# Patient Record
Sex: Female | Born: 1950 | Race: White | Hispanic: No | Marital: Married | Smoking: Never smoker
Health system: Southern US, Community
[De-identification: ages and names within clinical notes are randomized; demographics above are authoritative.]

## PROBLEM LIST (undated history)

## (undated) DIAGNOSIS — Z9889 Other specified postprocedural states: Secondary | ICD-10-CM

## (undated) DIAGNOSIS — Z973 Presence of spectacles and contact lenses: Secondary | ICD-10-CM

## (undated) DIAGNOSIS — R351 Nocturia: Secondary | ICD-10-CM

## (undated) DIAGNOSIS — R112 Nausea with vomiting, unspecified: Secondary | ICD-10-CM

## (undated) DIAGNOSIS — K449 Diaphragmatic hernia without obstruction or gangrene: Secondary | ICD-10-CM

## (undated) DIAGNOSIS — M1710 Unilateral primary osteoarthritis, unspecified knee: Secondary | ICD-10-CM

## (undated) DIAGNOSIS — E785 Hyperlipidemia, unspecified: Secondary | ICD-10-CM

## (undated) DIAGNOSIS — M1990 Unspecified osteoarthritis, unspecified site: Secondary | ICD-10-CM

## (undated) DIAGNOSIS — K5730 Diverticulosis of large intestine without perforation or abscess without bleeding: Secondary | ICD-10-CM

## (undated) DIAGNOSIS — E119 Type 2 diabetes mellitus without complications: Secondary | ICD-10-CM

## (undated) DIAGNOSIS — M179 Osteoarthritis of knee, unspecified: Secondary | ICD-10-CM

## (undated) DIAGNOSIS — K219 Gastro-esophageal reflux disease without esophagitis: Secondary | ICD-10-CM

## (undated) DIAGNOSIS — I10 Essential (primary) hypertension: Secondary | ICD-10-CM

## (undated) HISTORY — PX: KNEE ARTHROSCOPY: SUR90

## (undated) HISTORY — PX: APPENDECTOMY: SHX54

## (undated) HISTORY — PX: SHOULDER OPEN ROTATOR CUFF REPAIR: SHX2407

## (undated) HISTORY — PX: TUBAL LIGATION: SHX77

---

## 1992-09-08 HISTORY — PX: BREAST REDUCTION SURGERY: SHX8

## 2001-10-22 ENCOUNTER — Inpatient Hospital Stay (HOSPITAL_COMMUNITY): Admission: EM | Admit: 2001-10-22 | Discharge: 2001-10-23 | Payer: Self-pay | Admitting: Orthopedic Surgery

## 2009-06-22 ENCOUNTER — Encounter: Admission: RE | Admit: 2009-06-22 | Discharge: 2009-06-22 | Payer: Self-pay | Admitting: Orthopedic Surgery

## 2010-06-06 ENCOUNTER — Encounter
Admission: RE | Admit: 2010-06-06 | Discharge: 2010-08-21 | Payer: Self-pay | Source: Home / Self Care | Attending: Orthopedic Surgery | Admitting: Orthopedic Surgery

## 2011-01-24 NOTE — Op Note (Signed)
[facility]  Patient:    Krystal James, Krystal James Visit Number: [id number] MRN: [id number]          Service Type: SUR Location: 4W 0445 02 Attending Physician:  Marlowe Kays Page Dictated by:   Illene Labrador. Aplington, M.D. Proc. Date: 10/21/01 Admit Date:  10/21/2001                             Operative Report  PREOPERATIVE DIAGNOSIS:  Torn rotator cuff, right shoulder.  POSTOPERATIVE DIAGNOSIS:  Torn rotator cuff, right shoulder.  OPERATION: 1. Anterior acromionectomy. 2. Repair of torn rotator cuff, right shoulder.  SURGEON:  Illene Labrador. Aplington, M.D.  ASSISTANT:  Dorie Rank, P.A.-C.  ANESTHESIA:  General.  PATHOLOGY AND JUSTIFICATION FOR PROCEDURE:  She has had painful right shoulder for over a year and worse over the last 6-8 months.  Recent MRI has demonstrated a 1 cm tear with about 1 cm of retraction.  This was confirmed at surgery.  DESCRIPTION OF PROCEDURE:  Prophylactic antibiotics, preoperative intrascalene block by anesthesiologist, satisfactory general anesthesia, placed in the beach chair position on the Schlein frame.  The right shoulder girdle was prepped with DuraPrep and draped in a sterile field, Ioban employed.  Vertical incision from roughly the [facility] joint down to the greater tuberosity.  The incision was carried down to the anterior acromion with the fascia overlying it opened in line with the skin incision and reflected anteriorly and posteriorly back to the [facility] joint and also exposing the anterior acromion which I undermined with a small Cobb elevator followed by a large Cobb elevator to protect the underlying rotator cuff.  Then identified the [facility] joint with the [facility] needle, and marked out my initial line for anterior acromionectomy with cautery and then used a microsaw to make the initial cut.  She had a significant impingement problem, and I had to remove a good bit of underneath surface bone until the impingement problem was  corrected.  The tear was immediately identified and keeping with the MRI with about 1 cm in width and 1 cm of retraction at the level of the greater tuberosity.  After inspecting the underneath surface to make sure there was no retraction of underneath fibers, I created a small bony trough with the cautery and curved osteotome and small rongeur and made two lateral drill holes through the greater tuberosity.  I then placed a single #1 Ethibond horizontal mattress suture using these two holes and with the arm slightly abducted, tied the retracted portion of rotator cuff snugly in the bony trough.  I then supplemented this with multiple interrupted #1 Vicryl sutures over the terminal portion of the rotator cuff.  This gave a nice stable repair with impingement problem corrected.  The wound was then irrigated well with sterile saline.  The deltoid muscle I approximated with interrupted #1 Vicryl.  Because of the amount of anterior acromion I had to remove, I felt that she would have a better reconstitution of the deltoid fascial mechanism to the remainder of the acromion if I used sutures through drill holes in the anterior acromion.  So accordingly, I went ahead and made three drill holes and used three individual #1 Vicryl sutures supplemented by the normal fascial sutures of #1 Vicryl. This gave a nice stable, tight repair.  The wound was once irrigated with sterile saline.  Subcutaneous tissue was closed with a combination of #1 and 2-0 Vicryl with Steri-Strips  on the skin.  Dry sterile dressing, shoulder immobilizer applied.  She tolerated the procedure well and was taken to the recovery room with no known complications. Dictated by:   Illene Labrador. Aplington, M.D. Attending Physician:  Joaquin Courts DD:  10/21/01 TD:  10/21/01 Job: [id number]

## 2011-01-24 NOTE — H&P (Signed)
[facility]  Patient:    CHIYEKO, FERRE Visit Number: [id number] MRN: [id number]          Service Type: Attending:  Illene Labrador. Aplington, M.D. Dictated by:   Sammuel Cooper. Mahar, P.A. Adm. Date:  10/21/01                           History and Physical  DATE OF BIRTH:  [birth year]  CHIEF COMPLAINT:  Right shoulder pain.  HISTORY OF PRESENT ILLNESS:  The patient is a 60 year old female with a 12-15 month history of right shoulder pain.  MRI revealed a small, full thickness tear of the rotator cuff with retraction.  Because of these MRI findings and the patients significant pain, discomfort, and dysfunction in her shoulder, possibility of rotator cuff repair was discussed with the patient by Dr. Marlowe Kays, including risks and benefits.  The patient understands and agrees to proceed.  The patient denies any paresthesias, numbness, or paralysis of her extremities.  ALLERGIES:  SULFA causes a rash.  MEDICATIONS: 1. Norvasc 10 mg q.d. 2. Triamterene/hydrochlorothiazide 37.5/25 p.o. q.d. 3. Paxil CR 12.5 mg p.o. q.d. 4. Prevacid 30 mg p.o. q.d. 5. Vioxx 25 mg p.r.n. 6. Darvocet p.r.n.  PAST MEDICAL HISTORY: 1. Significant for asthma. 2. Anxiety. 3. Hypertension. 4. Hiatal hernia. 5. GERD. 6. History of ulcers. 7. Diverticulitis.  PAST SURGICAL HISTORY: 1. Tubal ligation in her 64s. 2. Appendectomy as a child. 3. Breast reduction in 1994.  SOCIAL HISTORY:  Denies tobacco use, denies alcohol use.  She is married, has two grown children.  Her husband will be available to help her postoperatively; however, he does work and she will be by herself throughout the days with occasional family assistance.  She does work as an Armed forces operational officer.  FAMILY MEDICAL HISTORY:  Mother alive at age 29 with coronary artery disease, hypertension and osteoarthritis.  Sister with hypertension and osteoarthritis. Father deceased at age 26 secondary to  cerebral aneurysm.  REVIEW OF SYSTEMS:  The patient denies any fevers, chills, sweats, or bleeding tendencies.  CENTRAL NERVOUS SYSTEM:  Denies blurred vision, double vision, seizures, headache, paralysis.  CARDIOVASCULAR:  Denies chest pain, angina, orthopnea, claudication, or palpitations.  PULMONARY:  Denies any shortness of breath, productive cough, or hemoptysis.  GASTROINTESTINAL:  Denies nausea, vomiting, constipation, diarrhea, melena, or bloody stools.  GENITOURINARY: Denies any dysuria, hematuria, discharge.  MUSCULOSKELETAL:  Per HPI.  PHYSICAL EXAMINATION:  VITAL SIGNS:  Blood pressure 150/88, respirations 16 and unlabored, pulse is 76 and regular.  GENERAL APPEARANCE:  The patient is a 60 year old white female.  She is alert and oriented in no acute distress, well-nourished, well-groomed, appears her stated age, very pleasant and cooperative to examination.  HEENT:  Head is normocephalic, atraumatic.  Pupils are equal, round, and reactive to light.  Extraocular movements intact.  Nares patent bilaterally. Pharynx is clear.  NECK:  Supple, soft to palpation.  No lymphadenopathy, no thyromegaly, no bruits appreciated.  CHEST:  Clear to auscultation bilaterally, no rales, rhonchi, stridor, or wheezes, or friction rub.  BREASTS:  Not pertinent, not performed.  HEART:  S1, S2, regular rate and rhythm, no murmurs, gallops, or rubs noted.  ABDOMEN:  Positive bowel sounds, soft to palpation, nontender, nondistended, no organomegaly noted.  GENITOURINARY:  Not pertinent, not performed.  EXTREMITIES:  Right shoulder with decreased range of motion secondary to pain and weakness.  Radial pulses are intact and equal bilaterally.  Sensation  is intact to bilateral upper extremities and equal.  Distal motor function is intact at 5/5.  SKIN:  Intact for any lesions or rashes.  LABORATORY:  MRI scan as above.  IMPRESSION: 1. Right rotator cuff tear. 2. Asthma. 3.  Anxiety. 4. Hypertension. 5. Hiatal hernia, gastroesophageal reflux disease, and history of ulcers. 6. History of diverticulitis.  PLAN:  Admit to [facility] on October 21, 2001 for a right rotator cuff repair by Dr. Marlowe Kays.  The patients primary care physician is Dr. Riley Nearing. Dictated by:   Sammuel Cooper. Mahar, P.A. Attending:  Illene Labrador. Aplington, M.D. DD:  10/13/01 TD:  10/13/01 Job: [id number]

## 2011-01-24 NOTE — Op Note (Signed)
[facility]  Patient:    Krystal James, Krystal James Visit Number: [id number] MRN: [id number]          Service Type: SUR Location: 4W 0445 02 Attending Physician:  Marlowe Kays Page Dictated by:   Illene Labrador. Aplington, M.D. Proc. Date: 10/21/01 Admit Date:  10/21/2001                             Operative Report  NO DICTATION. Dictated by:   Illene Labrador. Aplington, M.D. Attending Physician:  Joaquin Courts DD:  10/21/01 TD:  10/21/01 Job: [id number]

## 2012-09-14 ENCOUNTER — Emergency Department (HOSPITAL_BASED_OUTPATIENT_CLINIC_OR_DEPARTMENT_OTHER): Payer: BC Managed Care – PPO

## 2012-09-14 ENCOUNTER — Encounter (HOSPITAL_BASED_OUTPATIENT_CLINIC_OR_DEPARTMENT_OTHER): Payer: Self-pay | Admitting: *Deleted

## 2012-09-14 ENCOUNTER — Emergency Department (HOSPITAL_BASED_OUTPATIENT_CLINIC_OR_DEPARTMENT_OTHER)
Admission: EM | Admit: 2012-09-14 | Discharge: 2012-09-14 | Disposition: A | Discharge status: Home / Self Care | Payer: BC Managed Care – PPO | Attending: Emergency Medicine | Admitting: Emergency Medicine

## 2012-09-14 DIAGNOSIS — R209 Unspecified disturbances of skin sensation: Secondary | ICD-10-CM | POA: Insufficient documentation

## 2012-09-14 DIAGNOSIS — K219 Gastro-esophageal reflux disease without esophagitis: Secondary | ICD-10-CM | POA: Insufficient documentation

## 2012-09-14 DIAGNOSIS — E7800 Pure hypercholesterolemia, unspecified: Secondary | ICD-10-CM | POA: Insufficient documentation

## 2012-09-14 DIAGNOSIS — I10 Essential (primary) hypertension: Secondary | ICD-10-CM

## 2012-09-14 DIAGNOSIS — Z79899 Other long term (current) drug therapy: Secondary | ICD-10-CM | POA: Insufficient documentation

## 2012-09-14 DIAGNOSIS — E119 Type 2 diabetes mellitus without complications: Secondary | ICD-10-CM | POA: Insufficient documentation

## 2012-09-14 DIAGNOSIS — R4789 Other speech disturbances: Secondary | ICD-10-CM | POA: Insufficient documentation

## 2012-09-14 HISTORY — DX: Gastro-esophageal reflux disease without esophagitis: K21.9

## 2012-09-14 HISTORY — DX: Essential (primary) hypertension: I10

## 2012-09-14 LAB — BASIC METABOLIC PANEL
CO2: 27 mEq/L (ref 19–32)
Chloride: 99 mEq/L (ref 96–112)
Creatinine, Ser: 0.6 mg/dL (ref 0.50–1.10)

## 2012-09-14 LAB — CBC
MCH: 29.6 pg (ref 26.0–34.0)
RBC: 4.32 MIL/uL (ref 3.87–5.11)
RDW: 12.6 % (ref 11.5–15.5)

## 2012-09-14 MED ORDER — GABAPENTIN 100 MG PO CAPS: 100.0000 mg | ORAL_CAPSULE | Freq: Three times a day (TID) | ORAL | Status: DC

## 2012-09-14 NOTE — ED Notes (Addendum)
Headache this am. While at work she felt like her words were not coming out right per pt. She feels back to normal at this time. No hx of strokes or TIAs

## 2012-09-14 NOTE — ED Provider Notes (Signed)
History     CSN: [id number]  Arrival date & time 09/14/12  1721   First MD Initiated Contact with Patient 09/14/12 1750      Chief Complaint  Patient presents with  . Headache    (Consider location/radiation/quality/duration/timing/severity/associated sxs/prior treatment) HPI Pt presents with c/o sharp burning intermittent pains in right side of her head/scalp.  She states pains began this morning while brushing her hair.  She states she has had similar pains in the past but not this intense.  No trauma to head. No fever/neck pain.  No changes in vision or speech.  She states she was talking earlier and the pain was very intense causing her to have difficulty speaking.  She has not tried anything for her symptoms.  She has been taking her DM and HTN meds regularly.  There are no other associated systemic symptoms, there are no other alleviating or modifying factors.   Past Medical History  Diagnosis Date  . Hypertension   . Diabetes mellitus without complication   . GERD (gastroesophageal reflux disease)   . High cholesterol     Past Surgical History  Procedure Date  . Appendectomy   . Tubal ligation   . Knee surgery   . Foot surgery     No family history on file.  History  Substance Use Topics  . Smoking status: Never Smoker   . Smokeless tobacco: Not on file  . Alcohol Use: No    OB History    Grav Para Term Preterm Abortions TAB SAB Ect Mult Living                  Review of Systems ROS reviewed and all otherwise negative except for mentioned in HPI  Allergies  Sulfa antibiotics  Home Medications   Current Outpatient Rx  Name  Route  Sig  Dispense  Refill  . AMLODIPINE BESYLATE 10 MG PO TABS   Oral   Take 10 mg by mouth daily.         Marland Kitchen NEXIUM PO   Oral   Take by mouth.         . FENOFIBRATE 160 MG PO TABS   Oral   Take 160 mg by mouth daily.         Marland Kitchen METFORMIN HCL PO   Oral   Take by mouth.         . OMEGA-3-ACID ETHYL ESTERS 1 G  PO CAPS   Oral   Take 2 g by mouth 2 (two) times daily.         . CRESTOR PO   Oral   Take by mouth.         . TRIAMTERENE PO   Oral   Take by mouth.         Marland Kitchen GABAPENTIN 100 MG PO CAPS   Oral   Take 1 capsule (100 mg total) by mouth 3 (three) times daily.   90 capsule   0     BP 139/79  Pulse 64  Temp 97.6 F (36.4 C) (Oral)  Resp 18  SpO2 98% Vitals reviewed Physical Exam Physical Examination: General appearance - alert, well appearing, and in no distress Mental status - alert, oriented to person, place, and time Head - NCAT Eyes - pupils equal and reactive, extraocular eye movements intact Mouth - mucous membranes moist, pharynx normal without lesions Chest - clear to auscultation, no wheezes, rales or rhonchi, symmetric air entry Heart - normal rate, regular rhythm,  normal S1, S2, no murmurs, rubs, clicks or gallops Abdomen - soft, nontender, nondistended, no masses or organomegaly Neurological - alert, oriented, normal speech, cranial nerves 2-12 tested and intact, strength 5/5 in extremities x 4, sensation intact Extremities - peripheral pulses normal, no pedal edema, no clubbing or cyanosis Skin - normal coloration and turgor, no rashes  ED Course  Procedures (including critical care time)  Labs Reviewed  BASIC METABOLIC PANEL - Abnormal; Notable for the following:    Glucose, Bld 114 (*)     Calcium 10.6 (*)     All other components within normal limits  CBC   Ct Head Wo Contrast  09/14/2012  *RADIOLOGY REPORT*  Clinical Data: 62 year old female with headache and slurred speech.  CT HEAD WITHOUT CONTRAST  Technique:  Contiguous axial images were obtained from the base of the skull through the vertex without contrast.  Comparison: None  Findings: No intracranial abnormalities are identified, including mass lesion or mass effect, hydrocephalus, extra-axial fluid collection, midline shift, hemorrhage, or acute infarction.  The visualized bony calvarium is  unremarkable.  IMPRESSION: Unremarkable noncontrast head CT.   Original Report Authenticated By: Harmon Pier, M.D.      1. Scalp pain   2. Hypertension       MDM  Pt with lancinating intermittent scalp pains, no sign or symptoms of stroke or TIA, neuro exam normal.  Mildly hypertensive.  Head CT and labs reassuring.  Will start on neurontin and pt advised to f/u with PMD.  Discharged with strict return precautions.  Pt agreeable with plan.        Ethelda Chick, MD 09/14/12 2149

## 2012-09-14 NOTE — ED Notes (Signed)
Patient reports hx diabetes, states she has not checked her blood sugar since before Christmas. Pt states "I don't take good care of myself."

## 2012-09-14 NOTE — ED Notes (Signed)
Patient back from CT.

## 2012-09-14 NOTE — Discharge Instructions (Signed)
Return to the ED with any concerns including vomiting, changes in vision or speech, weakness of arms or legs, chest pain, shortness of breath, fainting, decreased level of alertness/lethargy, or any other alarming symptoms

## 2012-09-14 NOTE — ED Notes (Signed)
Patient transported to CT via stretcher.

## 2014-03-24 DIAGNOSIS — E1169 Type 2 diabetes mellitus with other specified complication: Secondary | ICD-10-CM | POA: Insufficient documentation

## 2014-03-24 DIAGNOSIS — E781 Pure hyperglyceridemia: Secondary | ICD-10-CM | POA: Insufficient documentation

## 2014-03-24 DIAGNOSIS — K219 Gastro-esophageal reflux disease without esophagitis: Secondary | ICD-10-CM | POA: Insufficient documentation

## 2014-03-24 DIAGNOSIS — E785 Hyperlipidemia, unspecified: Secondary | ICD-10-CM

## 2015-09-09 HISTORY — PX: ESOPHAGOGASTRODUODENOSCOPY: SHX1529

## 2016-09-08 HISTORY — PX: COLONOSCOPY: SHX174

## 2017-11-12 NOTE — Progress Notes (Signed)
Please place orders in Epic as patient is being scheduled for a pre-op appointment! Thank you! 

## 2017-11-30 NOTE — H&P (Signed)
TOTAL KNEE ADMISSION H&P  Patient is being admitted for right total knee arthroplasty.  Subjective:  Chief Complaint:   Right knee primary OA / pain  HPI: Krystal James, 67 y.o. female, has a history of pain and functional disability in the right knee due to arthritis and has failed non-surgical conservative treatments for greater than 12 weeks to includeNSAID's and/or analgesics, corticosteriod injections, viscosupplementation injections and activity modification.  Onset of symptoms was gradual, starting ~1 years ago with gradually worsening course since that time. The patient noted prior procedures on the knee to include  arthroscopy on the right knee(s).  Patient currently rates pain in the right knee(s) at 9 out of 10 with activity. Patient has night pain, worsening of pain with activity and weight bearing, pain that interferes with activities of daily living, pain with passive range of motion, crepitus and joint swelling.  Patient has evidence of periarticular osteophytes and joint space narrowing by imaging studies. There is no active infection.  Risks, benefits and expectations were discussed with the patient.  Risks including but not limited to the risk of anesthesia, blood clots, nerve damage, blood vessel damage, failure of the prosthesis, infection and up to and including death.  Patient understand the risks, benefits and expectations and wishes to proceed with surgery.   PCP: Patient, No Pcp Per  D/C Plans:       Home   Post-op Meds:       No Rx given  Tranexamic Acid:      To be given - IV   Decadron:      Is to be given  FYI:      ASA  Norco  DME:   Pt already has equipment  PT:   OPPT Rx given   Past Medical History:  Diagnosis Date  . Diabetes mellitus without complication   . GERD (gastroesophageal reflux disease)   . High cholesterol   . Hypertension     Past Surgical History:  Procedure Laterality Date  . APPENDECTOMY    . FOOT SURGERY    . KNEE SURGERY     . TUBAL LIGATION      No current facility-administered medications for this encounter.    Current Outpatient Medications  Medication Sig Dispense Refill Last Dose  . amLODipine (NORVASC) 10 MG tablet Take 10 mg by mouth daily. In the morning.     Marland Kitchen aspirin-acetaminophen-caffeine (EXCEDRIN MIGRAINE) 250-250-65 MG tablet Take 1 tablet by mouth every 6 (six) hours as needed for headache.     . esomeprazole (NEXIUM) 20 MG capsule Take 20 mg by mouth daily before breakfast.     . fenofibrate 160 MG tablet Take 160 mg by mouth daily.     Marland Kitchen lisinopril (PRINIVIL,ZESTRIL) 5 MG tablet Take 5 mg by mouth daily.     . metFORMIN (GLUCOPHAGE) 500 MG tablet Take 500 mg by mouth 3 (three) times daily with meals.     . rosuvastatin (CRESTOR) 10 MG tablet Take 10 mg by mouth daily with lunch.     . VOLTAREN 1 % GEL Apply 2 g topically 4 (four) times daily as needed. For knee pain.  0   . aspirin EC 81 MG tablet Take 81 mg by mouth daily.      Allergies  Allergen Reactions  . Sulfa Antibiotics Hives    Social History   Tobacco Use  . Smoking status: Never Smoker  Substance Use Topics  . Alcohol use: No  Review of Systems  Constitutional: Negative.   HENT: Negative.   Eyes: Negative.   Respiratory: Positive for shortness of breath (with exertion).   Cardiovascular: Negative.   Gastrointestinal: Positive for heartburn.  Genitourinary: Positive for frequency.  Musculoskeletal: Positive for joint pain.  Skin: Negative.   Neurological: Negative.   Endo/Heme/Allergies: Negative.   Psychiatric/Behavioral: Negative.     Objective:  Physical Exam  Constitutional: She is oriented to person, place, and time. She appears well-developed.  HENT:  Head: Normocephalic.  Eyes: Pupils are equal, round, and reactive to light.  Neck: Neck supple. No JVD present. No tracheal deviation present. No thyromegaly present.  Cardiovascular: Normal rate, regular rhythm and intact distal pulses.   Respiratory: Effort normal and breath sounds normal. No respiratory distress. She has no wheezes.  GI: Soft. There is no tenderness. There is no guarding.  Musculoskeletal:       Right knee: She exhibits decreased range of motion, swelling and bony tenderness. She exhibits no ecchymosis, no deformity, no laceration and no erythema. Tenderness found.  Lymphadenopathy:    She has no cervical adenopathy.  Neurological: She is alert and oriented to person, place, and time.  Skin: Skin is warm and dry.  Psychiatric: She has a normal mood and affect.      Imaging Review Plain radiographs demonstrate severe degenerative joint disease of the right knee.  The bone quality appears to be good for age and reported activity level.     Assessment/Plan:  End stage arthritis, right knee   The patient history, physical examination, clinical judgment of the provider and imaging studies are consistent with end stage degenerative joint disease of the right knee(s) and total knee arthroplasty is deemed medically necessary. The treatment options including medical management, injection therapy arthroscopy and arthroplasty were discussed at length. The risks and benefits of total knee arthroplasty were presented and reviewed. The risks due to aseptic loosening, infection, stiffness, patella tracking problems, thromboembolic complications and other imponderables were discussed. The patient acknowledged the explanation, agreed to proceed with the plan and consent was signed. Patient is being admitted for inpatient treatment for surgery, pain control, PT, OT, prophylactic antibiotics, VTE prophylaxis, progressive ambulation and ADL's and discharge planning. The patient is planning to be discharged home.    Anastasio AuerbachMatthew S. Alexiah Koroma   PA-C  11/30/2017, 4:22 PM

## 2017-12-04 ENCOUNTER — Encounter (HOSPITAL_COMMUNITY): Payer: Self-pay

## 2017-12-04 ENCOUNTER — Other Ambulatory Visit (HOSPITAL_COMMUNITY): Payer: Self-pay | Admitting: *Deleted

## 2017-12-04 ENCOUNTER — Encounter (HOSPITAL_COMMUNITY): Payer: Self-pay | Admitting: Certified Registered Nurse Anesthetist

## 2017-12-04 ENCOUNTER — Encounter (HOSPITAL_COMMUNITY)
Admission: RE | Admit: 2017-12-04 | Discharge: 2017-12-04 | Disposition: A | Discharge status: Home / Self Care | Payer: 59 | Source: Ambulatory Visit | Attending: Orthopedic Surgery | Admitting: Orthopedic Surgery

## 2017-12-04 ENCOUNTER — Other Ambulatory Visit: Payer: Self-pay

## 2017-12-04 DIAGNOSIS — E119 Type 2 diabetes mellitus without complications: Secondary | ICD-10-CM | POA: Insufficient documentation

## 2017-12-04 DIAGNOSIS — Z01812 Encounter for preprocedural laboratory examination: Secondary | ICD-10-CM | POA: Diagnosis present

## 2017-12-04 HISTORY — DX: Type 2 diabetes mellitus without complications: E11.9

## 2017-12-04 HISTORY — DX: Nausea with vomiting, unspecified: R11.2

## 2017-12-04 HISTORY — DX: Diaphragmatic hernia without obstruction or gangrene: K44.9

## 2017-12-04 HISTORY — DX: Diverticulosis of large intestine without perforation or abscess without bleeding: K57.30

## 2017-12-04 HISTORY — DX: Presence of spectacles and contact lenses: Z97.3

## 2017-12-04 HISTORY — DX: Osteoarthritis of knee, unspecified: M17.9

## 2017-12-04 HISTORY — DX: Other specified postprocedural states: Z98.890

## 2017-12-04 HISTORY — DX: Unilateral primary osteoarthritis, unspecified knee: M17.10

## 2017-12-04 HISTORY — DX: Unspecified osteoarthritis, unspecified site: M19.90

## 2017-12-04 HISTORY — DX: Nocturia: R35.1

## 2017-12-04 HISTORY — DX: Hyperlipidemia, unspecified: E78.5

## 2017-12-04 LAB — SURGICAL PCR SCREEN
MRSA, PCR: NEGATIVE
Staphylococcus aureus: POSITIVE — AB

## 2017-12-04 LAB — GLUCOSE, CAPILLARY: GLUCOSE-CAPILLARY: 83 mg/dL (ref 65–99)

## 2017-12-04 NOTE — Patient Instructions (Addendum)
Krystal James  12/04/2017   Your procedure is scheduled on: 12-07-17 (7:15 AM to 9:00AM)  Report to [facility] Main  Entrance    ARRIVE AT 5:30 AM. Have a seat in the Main Lobby. Please note there is a phone at the [employer]. Please call [phone] on that phone. Someone from Short Stay will come and get you from the Main Lobby and take you to Short Stay.  Call this number if you have problems the morning of surgery [phone]   Remember: Do not eat food or drink liquids :After Midnight.   How to Manage Your Diabetes Before and After Surgery  Why is it important to control my blood sugar before and after surgery? . Improving blood sugar levels before and after surgery helps healing and can limit problems. . A way of improving blood sugar control is eating a healthy diet by: o  Eating less sugar and carbohydrates o  Increasing activity/exercise o  Talking with your doctor about reaching your blood sugar goals . High blood sugars (greater than 180 mg/dL) can raise your risk of infections and slow your recovery, so you will need to focus on controlling your diabetes during the weeks before surgery. . Make sure that the doctor who takes care of your diabetes knows about your planned surgery including the date and location.  How do I manage my blood sugar before surgery? . Check your blood sugar at least 4 times a day, starting 2 days before surgery, to make sure that the level is not too high or low. o Check your blood sugar the morning of your surgery when you wake up and every 2 hours until you get to the Short Stay unit. . If your blood sugar is less than 70 mg/dL, you will need to treat for low blood sugar: o Do not take insulin. o Treat a low blood sugar (less than 70 mg/dL) with  cup of clear juice (cranberry or apple), 4 glucose tablets, OR glucose gel. o Recheck blood sugar in 15 minutes after treatment (to make sure it is greater than 70  mg/dL). If your blood sugar is not greater than 70 mg/dL on recheck, call [phone] for further instructions. . Report your blood sugar to the short stay nurse when you get to Short Stay.  . If you are admitted to the hospital after surgery: o Your blood sugar will be checked by the staff and you will probably be given insulin after surgery (instead of oral diabetes medicines) to make sure you have good blood sugar levels. o The goal for blood sugar control after surgery is 80-180 mg/dL.   WHAT DO I DO ABOUT MY DIABETES MEDICATION?  Marland Kitchen Do not take oral diabetes medicines (pills) the morning of surgery.  . THE DAY BEFORE SURGERY 12-06-17 TAKE YOUR METFORMIN AS USUAL.        Take these medicines the morning of surgery with A SIP OF WATER: AMLODIPINE (NORVASC), NEXIUM DO NOT TAKE ANY DIABETIC MEDICATIONS DAY OF YOUR SURGERY                               You may not have any metal on your body including hair pins and              piercings  Do not wear jewelry, make-up, lotions, powders  or perfumes, deodorant             Do not wear nail polish.  Do not shave  48 hours prior to surgery.                Do not bring valuables to the hospital. [facility] IS NOT             RESPONSIBLE   FOR VALUABLES.  Contacts, dentures or bridgework may not be worn into surgery.  Leave suitcase in the car. After surgery it may be brought to your room.                  Please read over the following fact sheets you were given: _____________________________________________________________________  [facility] - Preparing for Surgery Before surgery, you can play an important role.  Because skin is not sterile, your skin needs to be as free of germs as possible.  You can reduce the number of germs on your skin by washing with CHG (chlorahexidine gluconate) soap before surgery.  CHG is an antiseptic cleaner which kills germs and bonds with the skin to continue killing germs even after washing. Please DO NOT  use if you have an allergy to CHG or antibacterial soaps.  If your skin becomes reddened/irritated stop using the CHG and inform your nurse when you arrive at Short Stay. Do not shave (including legs and underarms) for at least 48 hours prior to the first CHG shower.  You may shave your face/neck. Please follow these instructions carefully:  1.  Shower with CHG Soap the night before surgery and the  morning of Surgery.  2.  If you choose to wash your hair, wash your hair first as usual with your  normal  shampoo.  3.  After you shampoo, rinse your hair and body thoroughly to remove the  shampoo.                           4.  Use CHG as you would any other liquid soap.  You can apply chg directly  to the skin and wash                       Gently with a scrungie or clean washcloth.  5.  Apply the CHG Soap to your body ONLY FROM THE NECK DOWN.   Do not use on face/ open                           Wound or open sores. Avoid contact with eyes, ears mouth and genitals (private parts).                       Wash face,  Genitals (private parts) with your normal soap.             6.  Wash thoroughly, paying special attention to the area where your surgery  will be performed.  7.  Thoroughly rinse your body with warm water from the neck down.  8.  DO NOT shower/wash with your normal soap after using and rinsing off  the CHG Soap.                9.  Pat yourself dry with a clean towel.            10.  Wear clean pajamas.  11.  Place clean sheets on your bed the night of your first shower and do not  sleep with pets. Day of Surgery : Do not apply any lotions/deodorants the morning of surgery.  Please wear clean clothes to the hospital/surgery center.  FAILURE TO FOLLOW THESE INSTRUCTIONS MAY RESULT IN THE CANCELLATION OF YOUR SURGERY PATIENT SIGNATURE_________________________________  NURSE  SIGNATURE__________________________________  ________________________________________________________________________   Krystal James  An incentive spirometer is a tool that can help keep your lungs clear and active. This tool measures how well you are filling your lungs with each breath. Taking long deep breaths may help reverse or decrease the chance of developing breathing (pulmonary) problems (especially infection) following:  A long period of time when you are unable to move or be active. BEFORE THE PROCEDURE   If the spirometer includes an indicator to show your [place] effort, your nurse or respiratory therapist will set it to a desired goal.  If possible, sit up straight or lean slightly forward. Try not to slouch.  Hold the incentive spirometer in an upright position. INSTRUCTIONS FOR USE  1. Sit on the edge of your bed if possible, or sit up as far as you can in bed or on a chair. 2. Hold the incentive spirometer in an upright position. 3. Breathe out normally. 4. Place the mouthpiece in your mouth and seal your lips tightly around it. 5. Breathe in slowly and as deeply as possible, raising the piston or the ball toward the top of the column. 6. Hold your breath for 3-5 seconds or for as long as possible. Allow the piston or ball to fall to the bottom of the column. 7. Remove the mouthpiece from your mouth and breathe out normally. 8. Rest for a few seconds and repeat Steps 1 through 7 at least 10 times every 1-2 hours when you are awake. Take your time and take a few normal breaths between deep breaths. 9. The spirometer may include an indicator to show your [place] effort. Use the indicator as a goal to work toward during each repetition. 10. After each set of 10 deep breaths, practice coughing to be sure your lungs are clear. If you have an incision (the cut made at the time of surgery), support your incision when coughing by placing a pillow or rolled up towels firmly  against it. Once you are able to get out of bed, walk around indoors and cough well. You may stop using the incentive spirometer when instructed by your caregiver.  RISKS AND COMPLICATIONS  Take your time so you do not get dizzy or light-headed.  If you are in pain, you may need to take or ask for pain medication before doing incentive spirometry. It is harder to take a deep breath if you are having pain. AFTER USE  Rest and breathe slowly and easily.  It can be helpful to keep track of a log of your progress. Your caregiver can provide you with a simple table to help with this. If you are using the spirometer at home, follow these instructions: [place] IF:   You are having difficultly using the spirometer.  You have trouble using the spirometer as often as instructed.  Your pain medication is not giving enough relief while using the spirometer.  You develop fever of 100.5 F (38.1 C) or higher. SEEK IMMEDIATE MEDICAL CARE IF:   You cough up bloody sputum that had not been present before.  You develop fever of 102 F (38.9 C)  or greater.  You develop worsening pain at or near the incision site. MAKE SURE YOU:   Understand these instructions.  Will watch your condition.  Will get help right away if you are not doing well or get worse. Document Released: 01/05/2007 Document Revised: 11/17/2011 Document Reviewed: 03/08/2007 ExitCare Patient Information 2014 ExitCare, [place].   ________________________________________________________________________  WHAT IS A BLOOD TRANSFUSION? Blood Transfusion Information  A transfusion is the replacement of blood or some of its parts. Blood is made up of multiple cells which provide different functions.  Red blood cells carry oxygen and are used for blood loss replacement.  White blood cells fight against infection.  Platelets control bleeding.  Plasma helps clot blood.  Other blood products are available for  specialized needs, such as hemophilia or other clotting disorders. BEFORE THE TRANSFUSION  Who gives blood for transfusions?   Healthy volunteers who are fully evaluated to make sure their blood is safe. This is blood bank blood. Transfusion therapy is the safest it has ever been in the practice of medicine. Before blood is taken from a donor, a complete history is taken to make sure that person has no history of diseases nor engages in risky social behavior (examples are intravenous drug use or sexual activity with multiple partners). The donor's travel history is screened to minimize risk of transmitting infections, such as malaria. The donated blood is tested for signs of infectious diseases, such as HIV and hepatitis. The blood is then tested to be sure it is compatible with you in order to minimize the chance of a transfusion reaction. If you or a relative donates blood, this is often done in anticipation of surgery and is not appropriate for emergency situations. It takes many days to process the donated blood. RISKS AND COMPLICATIONS Although transfusion therapy is very safe and saves many lives, the main dangers of transfusion include:   Getting an infectious disease.  Developing a transfusion reaction. This is an allergic reaction to something in the blood you were given. Every precaution is taken to prevent this. The decision to have a blood transfusion has been considered carefully by your caregiver before blood is given. Blood is not given unless the benefits outweigh the risks. AFTER THE TRANSFUSION  Right after receiving a blood transfusion, you will usually feel much better and more energetic. This is especially true if your red blood cells have gotten low (anemic). The transfusion raises the level of the red blood cells which carry oxygen, and this usually causes an energy increase.  The nurse administering the transfusion will monitor you carefully for complications. HOME CARE  INSTRUCTIONS  No special instructions are needed after a transfusion. You may find your energy is better. Speak with your caregiver about any limitations on activity for underlying diseases you may have. SEEK MEDICAL CARE IF:   Your condition is not improving after your transfusion.  You develop redness or irritation at the intravenous (IV) site. SEEK IMMEDIATE MEDICAL CARE IF:  Any of the following symptoms occur over the next 12 hours:  Shaking chills.  You have a temperature by mouth above 102 F (38.9 C), not controlled by medicine.  Chest, back, or muscle pain.  People around you feel you are not acting correctly or are confused.  Shortness of breath or difficulty breathing.  Dizziness and fainting.  You get a rash or develop hives.  You have a decrease in urine output.  Your urine turns a dark color or changes to pink, red,  or brown. Any of the following symptoms occur over the next 10 days:  You have a temperature by mouth above 102 F (38.9 C), not controlled by medicine.  Shortness of breath.  Weakness after normal activity.  The white part of the eye turns yellow (jaundice).  You have a decrease in the amount of urine or are urinating less often.  Your urine turns a dark color or changes to pink, red, or brown. Document Released: 08/22/2000 Document Revised: 11/17/2011 Document Reviewed: 04/10/2008 [facility] Patient Information 2014 [place], [place].  _______________________________________________________________________

## 2017-12-04 NOTE — Progress Notes (Addendum)
MEDICAL CLEARANCE DR Wende CreaseAGULAR ON CHART FOR 12-07-17 SURGERY WITH DR Charlann BoxerLIN EKG 11-26-17 DR Wende CreaseAGULAR ON CHART CBC 11-26-17, CMET, HEMAGLOBIN A1C, LIPID PROFILE, URINE ALBUMIN,  DR Wende CreaseAGULAR ON CHART

## 2017-12-05 LAB — ABO/RH: ABO/RH(D): O POS

## 2017-12-06 MED ORDER — TRANEXAMIC ACID 1000 MG/10ML IV SOLN
1000.0000 mg | INTRAVENOUS | Status: AC
  Administered 2017-12-07: 1000 mg via INTRAVENOUS
  Filled 2017-12-06: qty 1100

## 2017-12-07 ENCOUNTER — Observation Stay (HOSPITAL_COMMUNITY)
Admission: RE | Admit: 2017-12-07 | Discharge: 2017-12-08 | Disposition: A | Discharge status: Home / Self Care | Payer: 59 | Source: Ambulatory Visit | Attending: Orthopedic Surgery | Admitting: Orthopedic Surgery

## 2017-12-07 ENCOUNTER — Inpatient Hospital Stay (HOSPITAL_COMMUNITY): Payer: 59 | Admitting: Anesthesiology

## 2017-12-07 ENCOUNTER — Other Ambulatory Visit: Payer: Self-pay

## 2017-12-07 ENCOUNTER — Encounter (HOSPITAL_COMMUNITY): Payer: Self-pay | Admitting: Emergency Medicine

## 2017-12-07 ENCOUNTER — Encounter (HOSPITAL_COMMUNITY)
Admission: RE | Disposition: A | Discharge status: Home / Self Care | Payer: Self-pay | Source: Ambulatory Visit | Attending: Orthopedic Surgery

## 2017-12-07 DIAGNOSIS — Z7984 Long term (current) use of oral hypoglycemic drugs: Secondary | ICD-10-CM | POA: Insufficient documentation

## 2017-12-07 DIAGNOSIS — Z96659 Presence of unspecified artificial knee joint: Secondary | ICD-10-CM

## 2017-12-07 DIAGNOSIS — Z7982 Long term (current) use of aspirin: Secondary | ICD-10-CM | POA: Insufficient documentation

## 2017-12-07 DIAGNOSIS — M1711 Unilateral primary osteoarthritis, right knee: Secondary | ICD-10-CM | POA: Diagnosis present

## 2017-12-07 DIAGNOSIS — E785 Hyperlipidemia, unspecified: Secondary | ICD-10-CM | POA: Insufficient documentation

## 2017-12-07 DIAGNOSIS — R350 Frequency of micturition: Secondary | ICD-10-CM | POA: Insufficient documentation

## 2017-12-07 DIAGNOSIS — I10 Essential (primary) hypertension: Secondary | ICD-10-CM | POA: Insufficient documentation

## 2017-12-07 DIAGNOSIS — E7800 Pure hypercholesterolemia, unspecified: Secondary | ICD-10-CM | POA: Diagnosis not present

## 2017-12-07 DIAGNOSIS — E119 Type 2 diabetes mellitus without complications: Secondary | ICD-10-CM | POA: Insufficient documentation

## 2017-12-07 DIAGNOSIS — K219 Gastro-esophageal reflux disease without esophagitis: Secondary | ICD-10-CM | POA: Insufficient documentation

## 2017-12-07 DIAGNOSIS — Z79899 Other long term (current) drug therapy: Secondary | ICD-10-CM | POA: Insufficient documentation

## 2017-12-07 DIAGNOSIS — Z96651 Presence of right artificial knee joint: Secondary | ICD-10-CM

## 2017-12-07 DIAGNOSIS — Z882 Allergy status to sulfonamides status: Secondary | ICD-10-CM | POA: Insufficient documentation

## 2017-12-07 HISTORY — PX: TOTAL KNEE ARTHROPLASTY: SHX125

## 2017-12-07 LAB — GLUCOSE, CAPILLARY
GLUCOSE-CAPILLARY: 121 mg/dL — AB (ref 65–99)
GLUCOSE-CAPILLARY: 130 mg/dL — AB (ref 65–99)
GLUCOSE-CAPILLARY: 147 mg/dL — AB (ref 65–99)
Glucose-Capillary: 111 mg/dL — ABNORMAL HIGH (ref 65–99)
Glucose-Capillary: 174 mg/dL — ABNORMAL HIGH (ref 65–99)

## 2017-12-07 LAB — TYPE AND SCREEN
ABO/RH(D): O POS
ANTIBODY SCREEN: NEGATIVE

## 2017-12-07 SURGERY — ARTHROPLASTY, KNEE, TOTAL
Anesthesia: Spinal | Site: Knee | Laterality: Right

## 2017-12-07 MED ORDER — KETOROLAC TROMETHAMINE 30 MG/ML IJ SOLN
INTRAMUSCULAR | Status: AC
  Filled 2017-12-07: qty 1

## 2017-12-07 MED ORDER — SODIUM CHLORIDE 0.9 % IJ SOLN
INTRAMUSCULAR | Status: DC | PRN
  Administered 2017-12-07: 30 mL

## 2017-12-07 MED ORDER — AMLODIPINE BESYLATE 10 MG PO TABS
10.0000 mg | ORAL_TABLET | Freq: Every day | ORAL | Status: DC
  Administered 2017-12-08: 10 mg via ORAL
  Filled 2017-12-07 (×2): qty 1

## 2017-12-07 MED ORDER — METOCLOPRAMIDE HCL 5 MG/ML IJ SOLN: 5.0000 mg | Freq: Three times a day (TID) | INTRAMUSCULAR | Status: DC | PRN

## 2017-12-07 MED ORDER — ONDANSETRON HCL 4 MG/2ML IJ SOLN
INTRAMUSCULAR | Status: DC | PRN
  Administered 2017-12-07: 4 mg via INTRAVENOUS

## 2017-12-07 MED ORDER — ACETAMINOPHEN 325 MG PO TABS: 325.0000 mg | ORAL_TABLET | Freq: Four times a day (QID) | ORAL | Status: DC | PRN

## 2017-12-07 MED ORDER — DEXAMETHASONE SODIUM PHOSPHATE 10 MG/ML IJ SOLN
INTRAMUSCULAR | Status: AC
  Filled 2017-12-07: qty 1

## 2017-12-07 MED ORDER — METOCLOPRAMIDE HCL 5 MG PO TABS: 5.0000 mg | ORAL_TABLET | Freq: Three times a day (TID) | ORAL | Status: DC | PRN

## 2017-12-07 MED ORDER — SODIUM CHLORIDE 0.9 % IR SOLN
Status: DC | PRN
  Administered 2017-12-07: 1000 mL

## 2017-12-07 MED ORDER — BUPIVACAINE HCL (PF) 0.25 % IJ SOLN
INTRAMUSCULAR | Status: DC | PRN
  Administered 2017-12-07: 30 mL

## 2017-12-07 MED ORDER — ALUM & MAG HYDROXIDE-SIMETH 200-200-20 MG/5ML PO SUSP: 15.0000 mL | ORAL | Status: DC | PRN

## 2017-12-07 MED ORDER — LACTATED RINGERS IV SOLN
INTRAVENOUS | Status: DC | PRN
  Administered 2017-12-07 (×2): via INTRAVENOUS

## 2017-12-07 MED ORDER — TRANEXAMIC ACID 1000 MG/10ML IV SOLN
1000.0000 mg | Freq: Once | INTRAVENOUS | Status: AC
  Administered 2017-12-07: 13:00:00 1000 mg via INTRAVENOUS
  Filled 2017-12-07: qty 1100

## 2017-12-07 MED ORDER — POLYETHYLENE GLYCOL 3350 17 G PO PACK: 17.0000 g | PACK | Freq: Two times a day (BID) | ORAL | 0 refills | Status: DC

## 2017-12-07 MED ORDER — METHOCARBAMOL 500 MG PO TABS
500.0000 mg | ORAL_TABLET | Freq: Four times a day (QID) | ORAL | Status: DC | PRN
  Administered 2017-12-07 – 2017-12-08 (×2): 500 mg via ORAL
  Filled 2017-12-07 (×2): qty 1

## 2017-12-07 MED ORDER — NON FORMULARY: 20.0000 mg | Freq: Every day | Status: DC

## 2017-12-07 MED ORDER — MIDAZOLAM HCL 5 MG/5ML IJ SOLN
INTRAMUSCULAR | Status: DC | PRN
  Administered 2017-12-07 (×2): 1 mg via INTRAVENOUS

## 2017-12-07 MED ORDER — ESOMEPRAZOLE MAGNESIUM 20 MG PO CPDR
20.0000 mg | DELAYED_RELEASE_CAPSULE | Freq: Every day | ORAL | Status: DC
  Administered 2017-12-08: 20 mg via ORAL
  Filled 2017-12-07: qty 1

## 2017-12-07 MED ORDER — SODIUM CHLORIDE 0.9 % IV SOLN
INTRAVENOUS | Status: DC
  Administered 2017-12-07: 14:00:00 via INTRAVENOUS

## 2017-12-07 MED ORDER — FENTANYL CITRATE (PF) 100 MCG/2ML IJ SOLN
INTRAMUSCULAR | Status: AC
  Filled 2017-12-07: qty 2

## 2017-12-07 MED ORDER — DIPHENHYDRAMINE HCL 12.5 MG/5ML PO ELIX: 12.5000 mg | ORAL_SOLUTION | ORAL | Status: DC | PRN

## 2017-12-07 MED ORDER — CEFAZOLIN SODIUM-DEXTROSE 2-4 GM/100ML-% IV SOLN
2.0000 g | Freq: Four times a day (QID) | INTRAVENOUS | Status: AC
  Administered 2017-12-07 (×2): 2 g via INTRAVENOUS
  Filled 2017-12-07 (×2): qty 100

## 2017-12-07 MED ORDER — ONDANSETRON HCL 4 MG/2ML IJ SOLN: 4.0000 mg | Freq: Four times a day (QID) | INTRAMUSCULAR | Status: DC | PRN

## 2017-12-07 MED ORDER — DEXAMETHASONE SODIUM PHOSPHATE 10 MG/ML IJ SOLN
10.0000 mg | Freq: Once | INTRAMUSCULAR | Status: AC
  Administered 2017-12-07: 4 mg via INTRAVENOUS

## 2017-12-07 MED ORDER — FENTANYL CITRATE (PF) 100 MCG/2ML IJ SOLN
INTRAMUSCULAR | Status: AC
  Administered 2017-12-07: 50 ug via INTRAVENOUS
  Filled 2017-12-07: qty 2

## 2017-12-07 MED ORDER — POLYETHYLENE GLYCOL 3350 17 G PO PACK
17.0000 g | PACK | Freq: Two times a day (BID) | ORAL | Status: DC
  Administered 2017-12-08: 08:00:00 17 g via ORAL
  Filled 2017-12-07: qty 1

## 2017-12-07 MED ORDER — MORPHINE SULFATE (PF) 2 MG/ML IV SOLN
0.5000 mg | INTRAVENOUS | Status: DC | PRN
  Administered 2017-12-07 – 2017-12-08 (×4): 1 mg via INTRAVENOUS
  Filled 2017-12-07 (×4): qty 1

## 2017-12-07 MED ORDER — OXYCODONE HCL 5 MG PO TABS: 5.0000 mg | ORAL_TABLET | Freq: Once | ORAL | Status: DC | PRN

## 2017-12-07 MED ORDER — PHENYLEPHRINE 40 MCG/ML (10ML) SYRINGE FOR IV PUSH (FOR BLOOD PRESSURE SUPPORT)
PREFILLED_SYRINGE | INTRAVENOUS | Status: AC
  Filled 2017-12-07: qty 10

## 2017-12-07 MED ORDER — PROPOFOL 500 MG/50ML IV EMUL
INTRAVENOUS | Status: DC | PRN
  Administered 2017-12-07: 75 ug/kg/min via INTRAVENOUS

## 2017-12-07 MED ORDER — FENTANYL CITRATE (PF) 100 MCG/2ML IJ SOLN
INTRAMUSCULAR | Status: DC | PRN
  Administered 2017-12-07 (×2): 50 ug via INTRAVENOUS

## 2017-12-07 MED ORDER — HYDROCODONE-ACETAMINOPHEN 7.5-325 MG PO TABS: 1.0000 | ORAL_TABLET | ORAL | 0 refills | Status: DC | PRN

## 2017-12-07 MED ORDER — HYDROCODONE-ACETAMINOPHEN 7.5-325 MG PO TABS
1.0000 | ORAL_TABLET | ORAL | Status: DC | PRN
  Administered 2017-12-07: 2 via ORAL
  Administered 2017-12-07: 1 via ORAL
  Administered 2017-12-08 (×2): 2 via ORAL
  Filled 2017-12-07 (×3): qty 2
  Filled 2017-12-07: qty 1

## 2017-12-07 MED ORDER — SODIUM CHLORIDE 0.9 % IJ SOLN
INTRAMUSCULAR | Status: AC
  Filled 2017-12-07: qty 50

## 2017-12-07 MED ORDER — MIDAZOLAM HCL 2 MG/2ML IJ SOLN
INTRAMUSCULAR | Status: AC
  Filled 2017-12-07: qty 2

## 2017-12-07 MED ORDER — CHLORHEXIDINE GLUCONATE 4 % EX LIQD: 60.0000 mL | Freq: Once | CUTANEOUS | Status: DC

## 2017-12-07 MED ORDER — FERROUS SULFATE 325 (65 FE) MG PO TABS: 325.0000 mg | ORAL_TABLET | Freq: Three times a day (TID) | ORAL | 3 refills | Status: DC

## 2017-12-07 MED ORDER — BUPIVACAINE HCL (PF) 0.25 % IJ SOLN
INTRAMUSCULAR | Status: AC
  Filled 2017-12-07: qty 30

## 2017-12-07 MED ORDER — ROPIVACAINE HCL 5 MG/ML IJ SOLN
INTRAMUSCULAR | Status: DC | PRN
  Administered 2017-12-07: 20 mL via PERINEURAL

## 2017-12-07 MED ORDER — BISACODYL 10 MG RE SUPP: 10.0000 mg | Freq: Every day | RECTAL | Status: DC | PRN

## 2017-12-07 MED ORDER — STERILE WATER FOR IRRIGATION IR SOLN
Status: DC | PRN
  Administered 2017-12-07: 2000 mL

## 2017-12-07 MED ORDER — FERROUS SULFATE 325 (65 FE) MG PO TABS
325.0000 mg | ORAL_TABLET | Freq: Three times a day (TID) | ORAL | Status: DC
  Administered 2017-12-07 – 2017-12-08 (×3): 325 mg via ORAL
  Filled 2017-12-07 (×3): qty 1

## 2017-12-07 MED ORDER — OXYCODONE HCL 5 MG/5ML PO SOLN: 5.0000 mg | Freq: Once | ORAL | Status: DC | PRN

## 2017-12-07 MED ORDER — 0.9 % SODIUM CHLORIDE (POUR BTL) OPTIME
TOPICAL | Status: DC | PRN
  Administered 2017-12-07: 1000 mL

## 2017-12-07 MED ORDER — PROPOFOL 10 MG/ML IV BOLUS
INTRAVENOUS | Status: DC | PRN
  Administered 2017-12-07: 20 mg via INTRAVENOUS

## 2017-12-07 MED ORDER — TRAMADOL HCL 50 MG PO TABS
50.0000 mg | ORAL_TABLET | Freq: Four times a day (QID) | ORAL | Status: DC
  Administered 2017-12-07 – 2017-12-08 (×5): 50 mg via ORAL
  Filled 2017-12-07 (×6): qty 1

## 2017-12-07 MED ORDER — MAGNESIUM CITRATE PO SOLN: 1.0000 | Freq: Once | ORAL | Status: DC | PRN

## 2017-12-07 MED ORDER — ASPIRIN 81 MG PO CHEW: 81.0000 mg | CHEWABLE_TABLET | Freq: Two times a day (BID) | ORAL | 0 refills | Status: AC

## 2017-12-07 MED ORDER — INSULIN ASPART 100 UNIT/ML ~~LOC~~ SOLN
0.0000 [IU] | Freq: Three times a day (TID) | SUBCUTANEOUS | Status: DC
  Administered 2017-12-07: 19:00:00 2 [IU] via SUBCUTANEOUS
  Administered 2017-12-07: 3 [IU] via SUBCUTANEOUS
  Administered 2017-12-08: 08:00:00 2 [IU] via SUBCUTANEOUS
  Administered 2017-12-08: 3 [IU] via SUBCUTANEOUS

## 2017-12-07 MED ORDER — HYDROCODONE-ACETAMINOPHEN 5-325 MG PO TABS
1.0000 | ORAL_TABLET | ORAL | Status: DC | PRN
  Administered 2017-12-07 – 2017-12-08 (×2): 2 via ORAL
  Filled 2017-12-07 (×2): qty 2

## 2017-12-07 MED ORDER — METHOCARBAMOL 1000 MG/10ML IJ SOLN
500.0000 mg | Freq: Once | INTRAVENOUS | Status: AC
  Administered 2017-12-07: 500 mg via INTRAVENOUS
  Filled 2017-12-07: qty 550

## 2017-12-07 MED ORDER — PHENOL 1.4 % MT LIQD
1.0000 | OROMUCOSAL | Status: DC | PRN
  Filled 2017-12-07: qty 177

## 2017-12-07 MED ORDER — DOCUSATE SODIUM 100 MG PO CAPS
100.0000 mg | ORAL_CAPSULE | Freq: Two times a day (BID) | ORAL | Status: DC
  Administered 2017-12-07 – 2017-12-08 (×2): 100 mg via ORAL
  Filled 2017-12-07 (×2): qty 1

## 2017-12-07 MED ORDER — BUPIVACAINE IN DEXTROSE 0.75-8.25 % IT SOLN
INTRATHECAL | Status: DC | PRN
  Administered 2017-12-07: 1.8 mL via INTRATHECAL

## 2017-12-07 MED ORDER — BUPIVACAINE-EPINEPHRINE (PF) 0.5% -1:200000 IJ SOLN
INTRAMUSCULAR | Status: AC
  Filled 2017-12-07: qty 30

## 2017-12-07 MED ORDER — METFORMIN HCL 500 MG PO TABS
500.0000 mg | ORAL_TABLET | Freq: Three times a day (TID) | ORAL | Status: DC
  Administered 2017-12-07 – 2017-12-08 (×4): 500 mg via ORAL
  Filled 2017-12-07 (×5): qty 1

## 2017-12-07 MED ORDER — ASPIRIN 81 MG PO CHEW
81.0000 mg | CHEWABLE_TABLET | Freq: Two times a day (BID) | ORAL | Status: DC
  Administered 2017-12-07 – 2017-12-08 (×2): 81 mg via ORAL
  Filled 2017-12-07 (×2): qty 1

## 2017-12-07 MED ORDER — DEXAMETHASONE SODIUM PHOSPHATE 10 MG/ML IJ SOLN
10.0000 mg | Freq: Once | INTRAMUSCULAR | Status: AC
  Administered 2017-12-08: 10 mg via INTRAVENOUS
  Filled 2017-12-07: qty 1

## 2017-12-07 MED ORDER — ONDANSETRON HCL 4 MG PO TABS: 4.0000 mg | ORAL_TABLET | Freq: Four times a day (QID) | ORAL | Status: DC | PRN

## 2017-12-07 MED ORDER — KETOROLAC TROMETHAMINE 30 MG/ML IJ SOLN
INTRAMUSCULAR | Status: DC | PRN
  Administered 2017-12-07: 30 mg

## 2017-12-07 MED ORDER — ROSUVASTATIN CALCIUM 10 MG PO TABS
10.0000 mg | ORAL_TABLET | Freq: Every day | ORAL | Status: DC
  Administered 2017-12-07 – 2017-12-08 (×2): 10 mg via ORAL
  Filled 2017-12-07 (×3): qty 1

## 2017-12-07 MED ORDER — PHENYLEPHRINE 40 MCG/ML (10ML) SYRINGE FOR IV PUSH (FOR BLOOD PRESSURE SUPPORT)
PREFILLED_SYRINGE | INTRAVENOUS | Status: DC | PRN
  Administered 2017-12-07 (×5): 60 ug via INTRAVENOUS

## 2017-12-07 MED ORDER — MENTHOL 3 MG MT LOZG: 1.0000 | LOZENGE | OROMUCOSAL | Status: DC | PRN

## 2017-12-07 MED ORDER — PROPOFOL 10 MG/ML IV BOLUS
INTRAVENOUS | Status: AC
Start: 2017-12-07 — End: ?
  Filled 2017-12-07: qty 60

## 2017-12-07 MED ORDER — FENTANYL CITRATE (PF) 100 MCG/2ML IJ SOLN
25.0000 ug | INTRAMUSCULAR | Status: DC | PRN
  Administered 2017-12-07 (×3): 50 ug via INTRAVENOUS

## 2017-12-07 MED ORDER — CEFAZOLIN SODIUM-DEXTROSE 2-4 GM/100ML-% IV SOLN
2.0000 g | INTRAVENOUS | Status: AC
  Administered 2017-12-07: 1 g via INTRAVENOUS
  Filled 2017-12-07: qty 100

## 2017-12-07 MED ORDER — METHOCARBAMOL 500 MG PO TABS: 500.0000 mg | ORAL_TABLET | Freq: Four times a day (QID) | ORAL | 0 refills | Status: DC | PRN

## 2017-12-07 MED ORDER — ONDANSETRON HCL 4 MG/2ML IJ SOLN
INTRAMUSCULAR | Status: AC
  Filled 2017-12-07: qty 2

## 2017-12-07 MED ORDER — DOCUSATE SODIUM 100 MG PO CAPS: 100.0000 mg | ORAL_CAPSULE | Freq: Two times a day (BID) | ORAL | 0 refills | Status: DC

## 2017-12-07 SURGICAL SUPPLY — 52 items
ADH SKN CLS APL DERMABOND .7 (GAUZE/BANDAGES/DRESSINGS) ×1
BAG DECANTER FOR FLEXI CONT (MISCELLANEOUS) IMPLANT
BAG SPEC THK2 15X12 ZIP CLS (MISCELLANEOUS)
BAG ZIPLOCK 12X15 (MISCELLANEOUS) IMPLANT
BANDAGE ACE 6X5 VEL STRL LF (GAUZE/BANDAGES/DRESSINGS) ×3 IMPLANT
BLADE SAW SGTL 11.0X1.19X90.0M (BLADE) ×3 IMPLANT
BLADE SAW SGTL 13.0X1.19X90.0M (BLADE) ×3 IMPLANT
BONE CEMENT GENTAMICIN (Cement) ×6 IMPLANT
BOWL SMART MIX CTS (DISPOSABLE) ×3 IMPLANT
CAPT KNEE TOTAL 3 ATTUNE ×3 IMPLANT
CEMENT BONE GENTAMICIN 40 (Cement) ×2 IMPLANT
COVER SURGICAL LIGHT HANDLE (MISCELLANEOUS) ×3 IMPLANT
CUFF TOURN SGL QUICK 34 (TOURNIQUET CUFF) ×3
CUFF TRNQT CYL 34X4X40X1 (TOURNIQUET CUFF) ×1 IMPLANT
DECANTER SPIKE VIAL GLASS SM (MISCELLANEOUS) ×3 IMPLANT
DERMABOND ADVANCED (GAUZE/BANDAGES/DRESSINGS) ×2
DERMABOND ADVANCED .7 DNX12 (GAUZE/BANDAGES/DRESSINGS) ×1 IMPLANT
DRAPE TOP 10253 STERILE (DRAPES) IMPLANT
DRAPE U-SHAPE 47X51 STRL (DRAPES) ×3 IMPLANT
DRESSING AQUACEL AG SP 3.5X10 (GAUZE/BANDAGES/DRESSINGS) ×1 IMPLANT
DRSG AQUACEL AG SP 3.5X10 (GAUZE/BANDAGES/DRESSINGS) ×3
DURAPREP 26ML APPLICATOR (WOUND CARE) ×6 IMPLANT
ELECT REM PT RETURN 15FT ADLT (MISCELLANEOUS) ×3 IMPLANT
GLOVE BIOGEL M 7.0 STRL (GLOVE) IMPLANT
GLOVE BIOGEL PI IND STRL 7.0 (GLOVE) ×5 IMPLANT
GLOVE BIOGEL PI IND STRL 7.5 (GLOVE) ×3 IMPLANT
GLOVE BIOGEL PI IND STRL 9 (GLOVE) ×1 IMPLANT
GLOVE BIOGEL PI INDICATOR 7.0 (GLOVE) ×10
GLOVE BIOGEL PI INDICATOR 7.5 (GLOVE) ×6
GLOVE BIOGEL PI INDICATOR 9 (GLOVE) ×2
GLOVE ECLIPSE 7.5 STRL STRAW (GLOVE) ×3 IMPLANT
GLOVE ECLIPSE 8.5 STRL (GLOVE) ×3 IMPLANT
GLOVE ORTHO TXT STRL SZ7.5 (GLOVE) ×6 IMPLANT
GOWN STRL REUS W/TWL 2XL LVL3 (GOWN DISPOSABLE) ×3 IMPLANT
GOWN STRL REUS W/TWL LRG LVL3 (GOWN DISPOSABLE) ×12 IMPLANT
HANDPIECE INTERPULSE COAX TIP (DISPOSABLE) ×3
MANIFOLD NEPTUNE II (INSTRUMENTS) ×3 IMPLANT
PACK TOTAL KNEE CUSTOM (KITS) ×3 IMPLANT
POSITIONER SURGICAL ARM (MISCELLANEOUS) ×3 IMPLANT
SET HNDPC FAN SPRY TIP SCT (DISPOSABLE) ×1 IMPLANT
SET PAD KNEE POSITIONER (MISCELLANEOUS) ×3 IMPLANT
SUT MNCRL AB 4-0 PS2 18 (SUTURE) ×3 IMPLANT
SUT STRATAFIX PDS+ 0 24IN (SUTURE) ×3 IMPLANT
SUT VIC AB 1 CT1 36 (SUTURE) ×3 IMPLANT
SUT VIC AB 2-0 CT1 27 (SUTURE) ×9
SUT VIC AB 2-0 CT1 TAPERPNT 27 (SUTURE) ×3 IMPLANT
SYR 50ML LL SCALE MARK (SYRINGE) IMPLANT
TRAY FOLEY CATH 14FRSI W/METER (CATHETERS) ×3 IMPLANT
TRAY FOLEY W/METER SILVER 16FR (SET/KITS/TRAYS/PACK) IMPLANT
WATER STERILE IRR 1000ML POUR (IV SOLUTION) ×3 IMPLANT
WRAP KNEE MAXI GEL POST OP (GAUZE/BANDAGES/DRESSINGS) ×3 IMPLANT
YANKAUER SUCT BULB TIP 10FT TU (MISCELLANEOUS) ×3 IMPLANT

## 2017-12-07 NOTE — Transfer of Care (Signed)
Immediate Anesthesia Transfer of Care Note  Patient: Krystal James  Procedure(s) Performed: RIGHT TOTAL KNEE ARTHROPLASTY (Right Knee)  Patient Location: PACU  Anesthesia Type:Spinal  Level of Consciousness: drowsy and patient cooperative  Airway & Oxygen Therapy: Patient Spontanous Breathing and Patient connected to face mask  Post-op Assessment: Report given to RN and Post -op Vital signs reviewed and stable  Post vital signs: Reviewed and stable  Last Vitals:  Vitals Value Taken Time  BP    Temp    Pulse 73 12/07/2017  9:03 AM  Resp 18 12/07/2017  9:03 AM  SpO2 100 % 12/07/2017  9:03 AM  Vitals shown include unvalidated device data.  Last Pain:  Vitals:   12/07/17 0627  TempSrc:   PainSc: 0-No pain      Patients Stated Pain Goal: 4 (12/07/17 [id number])  Complications: No apparent anesthesia complications

## 2017-12-07 NOTE — Anesthesia Preprocedure Evaluation (Signed)
Anesthesia Evaluation  Patient identified by MRN, date of birth, ID band Patient awake    Reviewed: Allergy & Precautions, H&P , NPO status , Patient's Chart, lab work & pertinent test results  History of Anesthesia Complications (+) PONV and history of anesthetic complications  Airway Mallampati: II   Neck ROM: full    Dental   Pulmonary neg pulmonary ROS,    breath sounds clear to auscultation       Cardiovascular hypertension,  Rhythm:regular Rate:Normal     Neuro/Psych  Neuromuscular disease    GI/Hepatic hiatal hernia, GERD  ,  Endo/Other  diabetes, Type 2obese  Renal/GU      Musculoskeletal  (+) Arthritis ,   Abdominal   Peds  Hematology   Anesthesia Other Findings   Reproductive/Obstetrics                             Anesthesia Physical Anesthesia Plan  ASA: II  Anesthesia Plan: Spinal   Post-op Pain Management:  Regional for Post-op pain   Induction: Intravenous  PONV Risk Score and Plan: 3 and Ondansetron, Dexamethasone, Propofol infusion and Treatment may vary due to age or medical condition  Airway Management Planned: Simple Face Mask  Additional Equipment:   Intra-op Plan:   Post-operative Plan:   Informed Consent: I have reviewed the patients History and Physical, chart, labs and discussed the procedure including the risks, benefits and alternatives for the proposed anesthesia with the patient or authorized representative who has indicated his/her understanding and acceptance.     Plan Discussed with: CRNA, Anesthesiologist and Surgeon  Anesthesia Plan Comments:         Anesthesia Quick Evaluation

## 2017-12-07 NOTE — Anesthesia Postprocedure Evaluation (Signed)
Anesthesia Post Note  Patient: Krystal MelterBonnie F James  Procedure(s) Performed: RIGHT TOTAL KNEE ARTHROPLASTY (Right Knee)     Patient location during evaluation: PACU Anesthesia Type: Spinal Level of consciousness: oriented and awake and alert Pain management: pain level controlled Vital Signs Assessment: post-procedure vital signs reviewed and stable Respiratory status: spontaneous breathing, respiratory function stable and patient connected to nasal cannula oxygen Cardiovascular status: blood pressure returned to baseline and stable Postop Assessment: no headache, no backache and no apparent nausea or vomiting Anesthetic complications: no    Last Vitals:  Vitals:   12/07/17 1220 12/07/17 1319  BP: (!) 144/72 139/71  Pulse: 72 69  Resp: 13 12  Temp: (!) 36.4 C (!) 36.4 C  SpO2: 99% 99%    Last Pain:  Vitals:   12/07/17 1319  TempSrc: Oral  PainSc:                  [name]

## 2017-12-07 NOTE — Interval H&P Note (Signed)
History and Physical Interval Note:  12/07/2017 7:13 AM  Krystal James  has presented today for surgery, with the diagnosis of Right knee osteoarthritis  The various methods of treatment have been discussed with the patient and family. After consideration of risks, benefits and other options for treatment, the patient has consented to  Procedure(s) with comments: RIGHT TOTAL KNEE ARTHROPLASTY (Right) - 90 mins as a surgical intervention .  The patient's history has been reviewed, patient examined, no change in status, stable for surgery.  I have reviewed the patient's chart and labs.  Questions were answered to the patient's satisfaction.     Shelda PalMatthew D Emmalie Haigh

## 2017-12-07 NOTE — Anesthesia Procedure Notes (Signed)
Anesthesia Regional Block: Adductor canal block   Pre-Anesthetic Checklist: ,, timeout performed, Correct Patient, Correct Site, Correct Laterality, Correct Procedure, Correct Position, site marked, Risks and benefits discussed,  Surgical consent,  Pre-op evaluation,  At surgeon's request and post-op pain management  Laterality: Right  Prep: chloraprep       Needles:  Injection technique: Single-shot  Needle Type: Echogenic Needle     Needle Length: 9cm  Needle Gauge: 21     Additional Needles:   Narrative:  Start time: 12/07/2017 7:01 AM End time: 12/07/2017 7:10 AM Injection made incrementally with aspirations every 5 mL.  Performed by: Personally  Anesthesiologist: Achille RichHodierne, Nathanyal Ashmead, MD  Additional Notes: Pt tolerated the procedure well.

## 2017-12-07 NOTE — Op Note (Signed)
NAME:  Krystal James                      MEDICAL RECORD NO.:  [id number]                             FACILITY:  [facility]      PHYSICIAN:  Madlyn Frankel. Charlann Boxer, M.D.  DATE OF BIRTH:  [birth year]      DATE OF PROCEDURE:  12/07/2017                                     OPERATIVE REPORT         PREOPERATIVE DIAGNOSIS:  Right knee osteoarthritis.      POSTOPERATIVE DIAGNOSIS:  Right knee osteoarthritis.      FINDINGS:  The patient was noted to have complete loss of cartilage and   bone-on-bone arthritis with associated osteophytes in the lateral and patellofemoral compartments of   the knee with a significant synovitis and associated effusion.      PROCEDURE:  Right total knee replacement.      COMPONENTS USED:  DePuy Attune rotating platform posterior stabilized knee   system, a size 3 femur, 3 tibia, size 5 mm PS AOX insert, and 32 anatomic patellar   button.      SURGEON:  Madlyn Frankel. Charlann Boxer, M.D.      ASSISTANT:  Lanney Gins, PA-C.      ANESTHESIA:  Regional and Spinal.      SPECIMENS:  None.      COMPLICATION:  None.      DRAINS:  None.  EBL: <100cc      TOURNIQUET TIME:   Total Tourniquet Time Documented: Thigh (Right) - 24 minutes Total: Thigh (Right) - 24 minutes  .      The patient was stable to the recovery room.      INDICATION FOR PROCEDURE:  Krystal James is a 67 y.o. female patient of   mine.  The patient had been seen, evaluated, and treated conservatively in the   office with medication, activity modification, and injections.  The patient had   radiographic changes of bone-on-bone arthritis with endplate sclerosis and osteophytes noted.      The patient failed conservative measures including medication, injections, and activity modification, and at this point was ready for more definitive measures.   Based on the radiographic changes and failed conservative measures, the patient   decided to proceed with total knee replacement.  Risks of infection,   DVT,  component failure, need for revision surgery, postop course, and   expectations were all   discussed and reviewed.  Consent was obtained for benefit of pain   relief.      PROCEDURE IN DETAIL:  The patient was brought to the operative theater.   Once adequate anesthesia, preoperative antibiotics, 2 gm of Ancef, 1 gm of Tranexamic Acid, and 10 mg of Decadron administered, the patient was positioned supine with the right thigh tourniquet placed.  The  right lower extremity was prepped and draped in sterile fashion.  A time-   out was performed identifying the patient, planned procedure, and   extremity.      The right lower extremity was placed in the [facility] leg holder.  The leg was   exsanguinated, tourniquet elevated to 250 mmHg.  A midline incision was  made followed by median parapatellar arthrotomy.  Following initial   exposure, attention was first directed to the patella.  Precut   measurement was noted to be 21 mm.  I resected down to 14 mm and used a   32 anatomic patellar button to restore patellar height as well as cover the cut   surface.      The lug holes were drilled and a metal shim was placed to protect the   patella from retractors and saw blades.      At this point, attention was now directed to the femur.  The femoral   canal was opened with a drill, irrigated to try to prevent fat emboli.  An   intramedullary rod was passed at 3 degrees valgus, 9 mm of bone was   resected off the distal femur.  Following this resection, the tibia was   subluxated anteriorly.  Using the extramedullary guide, 2 mm of bone was resected off   the proximal lateral tibia.  We confirmed the gap would be   stable medially and laterally with a size 5 spacer block as well as confirmed   the cut was perpendicular in the coronal plane, checking with an alignment rod.      Once this was done, I sized the femur to be a size 3 in the anterior-   posterior dimension, chose a standard component  based on medial and   lateral dimension.  The size 3 rotation block was then pinned in   position anterior referenced using the C-clamp to set rotation.  The   anterior, posterior, and  chamfer cuts were made without difficulty nor   notching making certain that I was along the anterior cortex to help   with flexion gap stability.      The final box cut was made off the lateral aspect of distal femur.      At this point, the tibia was sized to be a size 3, the size 3 tray was   then pinned in position through the medial third of the tubercle,   drilled, and keel punched.  Trial reduction was now carried with a 3 femur,  3 tibia, a size 5 mm PS insert, and the 32 anatomic patella botton.  The knee was brought to   extension, full extension with good flexion stability with the patella   tracking through the trochlea without application of pressure.  Given   all these findings the femoral lug holes were drilled and then the trial components removed.  Final components were   opened and cement was mixed.  The knee was irrigated with normal saline   solution and pulse lavage.  The synovial lining was   then injected with 30 cc 0.25% Marcaine without epinephrine and 1 cc of Toradol plus 30 cc of NS for a total of 61 cc.      The knee was irrigated.  Final implants were then cemented onto clean and   dried cut surfaces of bone with the knee brought to extension with a size 5   mm PS trial insert.      Once the cement had fully cured, the excess cement was removed   throughout the knee.  I confirmed I was satisfied with the range of   motion and stability, and the final size 5 mm PS AOX insert was chosen.  It was   placed into the knee.      The tourniquet had been let down  at 24 minutes.  No significant   hemostasis required.  The   extensor mechanism was then reapproximated using #1 Vicryl and #1 Stratafix sutures with the knee   in flexion.  The   remaining wound was closed with 2-0 Vicryl  and running 4-0 Monocryl.   The knee was cleaned, dried, dressed sterilely using Dermabond and   Aquacel dressing.  The patient was then   brought to recovery room in stable condition, tolerating the procedure   well.   Please note that Physician Assistant, Lanney Gins, PA-C, was present for the entirety of the case, and was utilized for pre-operative positioning, peri-operative retractor management, general facilitation of the procedure.  He was also utilized for primary wound closure at the end of the case.              Madlyn Frankel Charlann Boxer, M.D.    12/07/2017 8:35 AM

## 2017-12-07 NOTE — Evaluation (Addendum)
Physical Therapy Evaluation Patient Details Name: Krystal MelterBonnie F Kertz MRN: [id number] DOB: [birth year] Today's Date: 12/07/2017   History of Present Illness  R TKA  Clinical Impression  The patient ambulated x 40' today. Plans Dc to home. Pt admitted with above diagnosis. Pt currently with functional limitations due to the deficits listed below (see PT Problem List).  Pt will benefit from skilled PT to increase their independence and safety with mobility to allow discharge to the venue listed below.       Follow Up Recommendations Follow surgeon's recommendation for DC plan and follow-up therapies    Equipment Recommendations  None recommended by PT    Recommendations for Other Services   OT consult.    Precautions / Restrictions Precautions Precautions: Knee;Fall      Mobility  Bed Mobility Overal bed mobility: Needs Assistance Bed Mobility: Supine to Sit     Supine to sit: Min assist     General bed mobility comments: support right leg  Transfers Overall transfer level: Needs assistance Equipment used: Rolling walker (2 wheeled) Transfers: Sit to/from Stand Sit to Stand: Min assist         General transfer comment: cues for right leg and hand position  Ambulation/Gait Ambulation/Gait assistance: Min assist Ambulation Distance (Feet): 40 Feet Assistive device: Rolling walker (2 wheeled) Gait Pattern/deviations: Step-to pattern;Antalgic     General Gait Details: cues for sequence  Stairs            Wheelchair Mobility    Modified Rankin (Stroke Patients Only)       Balance                                             Pertinent Vitals/Pain Pain Assessment: 0-10 Pain Score: 4  Pain Location: right knee Pain Descriptors / Indicators: Operative site guarding;Discomfort Pain Intervention(s): Premedicated before session;Monitored during session;Ice applied;Repositioned    Home Living Family/patient expects to be discharged to::  Private residence Living Arrangements: Spouse/significant other Available Help at Discharge: Family Type of Home: House Home Access: Stairs to enter Entrance Stairs-Rails: Can reach both;Left;Right Entrance Stairs-Number of Steps: 3 Home Layout: One level Home Equipment: Environmental consultantWalker - 2 wheels      Prior Function Level of Independence: Independent               Hand Dominance        Extremity/Trunk Assessment   Upper Extremity Assessment Upper Extremity Assessment: Overall WFL for tasks assessed    Lower Extremity Assessment Lower Extremity Assessment: RLE deficits/detail RLE Deficits / Details: able to perform SLR, knee flexion to 50*       Communication   Communication: No difficulties  Cognition Arousal/Alertness: Awake/alert Behavior During Therapy: WFL for tasks assessed/performed Overall Cognitive Status: Within Functional Limits for tasks assessed                                        General Comments      Exercises     Assessment/Plan    PT Assessment Patient needs continued PT services  PT Problem List Decreased strength;Decreased range of motion;Decreased knowledge of use of DME;Decreased activity tolerance;Decreased safety awareness;Decreased knowledge of precautions;Decreased mobility;Pain       PT Treatment Interventions DME instruction;Therapeutic exercise;Gait training;Stair training;Functional mobility training;Patient/family education  PT Goals (Current goals can be found in the Care Plan section)  Acute Rehab PT Goals Patient Stated Goal: to walk without pain PT Goal Formulation: With patient Time For Goal Achievement: 12/12/17 Potential to Achieve Goals: Good    Frequency 7X/week   Barriers to discharge        Co-evaluation               AM-PAC PT "6 Clicks" Daily Activity  Outcome Measure Difficulty turning over in bed (including adjusting bedclothes, sheets and blankets)?: A Lot Difficulty moving  from lying on back to sitting on the side of the bed? : A Lot Difficulty sitting down on and standing up from a chair with arms (e.g., wheelchair, bedside commode, etc,.)?: A Lot Help needed moving to and from a bed to chair (including a wheelchair)?: A Lot Help needed walking in hospital room?: A Lot Help needed climbing 3-5 steps with a railing? : Total 6 Click Score: 11    End of Session   Activity Tolerance: Patient tolerated treatment well Patient left: in chair;with call bell/phone within reach;with family/visitor present Nurse Communication: Mobility status PT Visit Diagnosis: Unsteadiness on feet (R26.81);Pain Pain - Right/Left: Right Pain - part of body: Knee    Time: 4098-1191 PT Time Calculation (min) (ACUTE ONLY): 24 min   Charges:   PT Evaluation $PT Eval Low Complexity: 1 Low PT Treatments $Gait Training: 8-22 mins   PT G CodesBlanchard Kelch PT [phone]   Rada Hay 12/07/2017, 6:11 PM

## 2017-12-07 NOTE — Anesthesia Procedure Notes (Signed)
Spinal  Patient location during procedure: OR Start time: 12/07/2017 7:23 AM End time: 12/07/2017 7:25 AM Staffing Anesthesiologist: Achille RichHodierne, Chene Kasinger, MD Performed: anesthesiologist  Preanesthetic Checklist Completed: patient identified, surgical consent, pre-op evaluation, timeout performed, IV checked, risks and benefits discussed and monitors and equipment checked Spinal Block Patient position: sitting Prep: DuraPrep Patient monitoring: cardiac monitor, continuous pulse ox and blood pressure Approach: midline Location: L3-4 Injection technique: single-shot Needle Needle type: Pencan  Needle gauge: 24 G Needle length: 9 cm Assessment Sensory level: T10 Additional Notes Functioning IV was confirmed and monitors were applied. Sterile prep and drape, including hand hygiene and sterile gloves were used. The patient was positioned and the spine was prepped. The skin was anesthetized with lidocaine.  Free flow of clear CSF was obtained prior to injecting local anesthetic into the CSF.  The spinal needle aspirated freely following injection.  The needle was carefully withdrawn.  The patient tolerated the procedure well.

## 2017-12-08 ENCOUNTER — Encounter (HOSPITAL_COMMUNITY): Payer: Self-pay | Admitting: Orthopedic Surgery

## 2017-12-08 DIAGNOSIS — M1711 Unilateral primary osteoarthritis, right knee: Secondary | ICD-10-CM | POA: Diagnosis not present

## 2017-12-08 LAB — BASIC METABOLIC PANEL
Anion gap: 10 (ref 5–15)
BUN: 11 mg/dL (ref 6–20)
CHLORIDE: 99 mmol/L — AB (ref 101–111)
CO2: 21 mmol/L — ABNORMAL LOW (ref 22–32)
Calcium: 9.2 mg/dL (ref 8.9–10.3)
Creatinine, Ser: 0.59 mg/dL (ref 0.44–1.00)
GFR calc Af Amer: >60 mL/min (ref >60)
GFR calc non Af Amer: >60 mL/min (ref >60)
GLUCOSE: 112 mg/dL — AB (ref 65–99)
POTASSIUM: 4.6 mmol/L (ref 3.5–5.1)
Sodium: 130 mmol/L — ABNORMAL LOW (ref 135–145)

## 2017-12-08 LAB — CBC
HCT: 28.7 % — ABNORMAL LOW (ref 36.0–46.0)
HEMOGLOBIN: 9.8 g/dL — AB (ref 12.0–15.0)
MCH: 27.8 pg (ref 26.0–34.0)
MCHC: 34.1 g/dL (ref 30.0–36.0)
MCV: 81.3 fL (ref 78.0–100.0)
Platelets: 166 10*3/uL (ref 150–400)
RBC: 3.53 MIL/uL — AB (ref 3.87–5.11)
RDW: 13.5 % (ref 11.5–15.5)
WBC: 9.1 10*3/uL (ref 4.0–10.5)

## 2017-12-08 LAB — GLUCOSE, CAPILLARY
GLUCOSE-CAPILLARY: 155 mg/dL — AB (ref 65–99)
Glucose-Capillary: 131 mg/dL — ABNORMAL HIGH (ref 65–99)

## 2017-12-08 NOTE — Progress Notes (Signed)
Physical Therapy Treatment Patient Details Name: Krystal James Krystal James MRN: [id number] DOB: Oct 15, 1950 Today's Date: 12/08/2017    History of Present Illness R TKA    PT Comments    POD # 1 am session Assisted with amb a greater distance, performed all supine TKR then assisted to bathroom.    Follow Up Recommendations  Follow surgeon's recommendation for DC plan and follow-up therapies     Equipment Recommendations  None recommended by PT    Recommendations for Other Services       Precautions / Restrictions Precautions Precautions: Knee;Fall Restrictions Weight Bearing Restrictions: No    Mobility  Bed Mobility               General bed mobility comments: OOB in recliner  Transfers Overall transfer level: Needs assistance Equipment used: Rolling walker (2 wheeled) Transfers: Sit to/from Stand Sit to Stand: Supervision;Min guard         General transfer comment: increased time and safety with turns  Ambulation/Gait Ambulation/Gait assistance: Supervision;Min guard Ambulation Distance (Feet): 55 Feet Assistive device: Rolling walker (2 wheeled) Gait Pattern/deviations: Step-to pattern;Antalgic Gait velocity: decreased   General Gait Details: cues for sequence and safety with turns   Stairs            Wheelchair Mobility    Modified Rankin (Stroke Patients Only)       Balance                                            Cognition Arousal/Alertness: Awake/alert Behavior During Therapy: WFL for tasks assessed/performed Overall Cognitive Status: Within Functional Limits for tasks assessed                                        Exercises   Total Knee Replacement TE's 10 reps B LE ankle pumps 10 reps towel squeezes 10 reps knee presses 10 reps heel slides  10 reps SAQ's 10 reps SLR's 10 reps ABD Followed by ICE    General Comments        Pertinent Vitals/Pain Pain Assessment: 0-10 Pain Score: 3   Pain Location: right knee Pain Descriptors / Indicators: Operative site guarding;Discomfort Pain Intervention(s): Monitored during session;Repositioned;Premedicated before session;Ice applied    Home Living                      Prior Function            PT Goals (current goals can now be found in the care plan section) Progress towards PT goals: Progressing toward goals    Frequency    7X/week      PT Plan Current plan remains appropriate    Co-evaluation              AM-PAC PT "6 Clicks" Daily Activity  Outcome Measure  Difficulty turning over in bed (including adjusting bedclothes, sheets and blankets)?: A Little Difficulty moving from lying on back to sitting on the side of the bed? : A Little Difficulty sitting down on and standing up from a chair with arms (e.g., wheelchair, bedside commode, etc,.)?: A Little Help needed moving to and from a bed to chair (including a wheelchair)?: A Little Help needed walking in hospital room?: A Little Help needed climbing 3-5 steps with  a railing? : A Lot 6 Click Score: 17    End of Session Equipment Utilized During Treatment: Gait belt Activity Tolerance: Patient tolerated treatment well Patient left: in chair;with call bell/phone within reach;with family/visitor present Nurse Communication: Mobility status PT Visit Diagnosis: Unsteadiness on feet (R26.81);Pain Pain - Right/Left: Right Pain - part of body: Knee     Time: 1610-9604 PT Time Calculation (min) (ACUTE ONLY): 42 min  Charges:  $Gait Training: 8-22 mins $Therapeutic Exercise: 8-22 mins $Therapeutic Activity: 8-22 mins                    G Codes:       {Caressa Scearce  PTA [facility]  Acute  Rehab Pager      [phone]

## 2017-12-08 NOTE — Evaluation (Signed)
Occupational Therapy Evaluation Patient Details Name: Krystal James MRN: [id number] DOB: Mar 03, 1951 Today's Date: 12/08/2017    History of Present Illness R TKA   Clinical Impression   This 67 year old female was admitted for the above sx.  She will benefit from one more session of OT to further educate on shower transfer.  Husband present for session.    Follow Up Recommendations  Supervision/Assistance - 24 hour    Equipment Recommendations  3 in 1 bedside commode .  Needed for nighttime use.  Pt up every 2 hours   Recommendations for Other Services       Precautions / Restrictions Precautions Precautions: Knee;Fall Restrictions Weight Bearing Restrictions: No      Mobility Bed Mobility               General bed mobility comments: oob  Transfers   Equipment used: Rolling walker (2 wheeled)   Sit to Stand: Min assist         General transfer comment: steadying assist    Balance                                           ADL either performed or assessed with clinical judgement   ADL Overall ADL's : Needs assistance/impaired     Grooming: Wash/dry hands;Oral care;Supervision/safety;Standing       Lower Body Bathing: Moderate assistance;Sit to/from stand       Lower Body Dressing: Maximal assistance;Sit to/from stand   Toilet Transfer: Minimal assistance;Ambulation;Comfort height toilet;RW   Toileting- Clothing Manipulation and Hygiene: Moderate assistance;Sit to/from stand         General ADL Comments: husband present and will assist with adls as needed. Pt can perform UB with set up.  Min A to support leg to floor when sitting on commode.  Her commode is also high at home:  she will need something under foot for comfort.  Demonstrated shower transfer:  pt in too much pain to practice. Gave them a handout on this.  Reviewed precautions. Pt demonstrates good safety     Vision         Perception     Praxis       Pertinent Vitals/Pain Pain Score: 7  Pain Location: right knee Pain Descriptors / Indicators: Operative site guarding;Discomfort Pain Intervention(s): Limited activity within patient's tolerance;Monitored during session;Premedicated before session;Repositioned;Ice applied     Hand Dominance     Extremity/Trunk Assessment Upper Extremity Assessment Upper Extremity Assessment: Overall WFL for tasks assessed           Communication Communication Communication: No difficulties   Cognition Arousal/Alertness: Awake/alert Behavior During Therapy: WFL for tasks assessed/performed Overall Cognitive Status: Within Functional Limits for tasks assessed                                     General Comments       Exercises     Shoulder Instructions      Home Living Family/patient expects to be discharged to:: Private residence Living Arrangements: Spouse/significant other Available Help at Discharge: Family               Bathroom Shower/Tub: Walk-in Soil scientistshower   Bathroom Toilet: Handicapped height     Home Equipment: Environmental consultantWalker - 2 wheels  Prior Functioning/Environment Level of Independence: Independent                 OT Problem List: Pain;Decreased knowledge of use of DME or AE      OT Treatment/Interventions: Self-care/ADL training;DME and/or AE instruction;Patient/family education    OT Goals(Current goals can be found in the care plan section) Acute Rehab OT Goals Patient Stated Goal: to walk without pain OT Goal Formulation: With patient Time For Goal Achievement: 12/22/17 Potential to Achieve Goals: Good ADL Goals Pt Will Transfer to Toilet: with supervision;ambulating;bedside commode Pt Will Perform Tub/Shower Transfer: Shower transfer;with min guard assist;shower seat;ambulating  OT Frequency: Min 2X/week   Barriers to D/C:            Co-evaluation              AM-PAC PT "6 Clicks" Daily Activity     Outcome  Measure Help from another person eating meals?: None Help from another person taking care of personal grooming?: A Little Help from another person toileting, which includes using toliet, bedpan, or urinal?: A Little Help from another person bathing (including washing, rinsing, drying)?: A Lot Help from another person to put on and taking off regular upper body clothing?: A Little Help from another person to put on and taking off regular lower body clothing?: A Lot 6 Click Score: 17   End of Session    Activity Tolerance: Patient tolerated treatment well Patient left: in chair;with call bell/phone within reach;with family/visitor present  OT Visit Diagnosis: Pain Pain - Right/Left: Right Pain - part of body: Knee                Time: 1610-9604 OT Time Calculation (min): 26 min Charges:  OT General Charges $OT Visit: 1 Visit OT Evaluation $OT Eval Low Complexity: 1 Low OT Treatments $Self Care/Home Management : 8-22 mins G-Codes:     [place], OTR/L [phone] 12/08/2017  Krystal James 12/08/2017, 10:10 AM

## 2017-12-08 NOTE — Progress Notes (Signed)
    Durable Medical Equipment  (From admission, onward)        Start     Ordered   12/08/17 0956  For home use only DME 3 n 1  Once     12/08/17 [phone]

## 2017-12-08 NOTE — Progress Notes (Signed)
Patient ID: Krystal James, female   DOB: 1951/05/06, 67 y.o.   MRN: [id number] Subjective: 1 Day Post-Op Procedure(s) (LRB): RIGHT TOTAL KNEE ARTHROPLASTY (Right)    Patient reports pain as moderate.  Up this am eating breakfast in chair.  Feeling more discomfort at the moment.  No events overnight however  Objective:   VITALS:   Vitals:   12/08/17 0200 12/08/17 0507  BP: (!) 140/59 138/62  Pulse: 72 79  Resp: 18 15  Temp: 97.9 F (36.6 C) 97.7 F (36.5 C)  SpO2: 99% 98%    Neurovascular intact Incision: dressing C/D/I  LABS Recent Labs    12/08/17 0523  HGB 9.8*  HCT 28.7*  WBC 9.1  PLT 166    Recent Labs    12/08/17 0523  NA 130*  K 4.6  BUN 11  CREATININE 0.59  GLUCOSE 112*    No results for input(s): LABPT, INR in the last 72 hours.   Assessment/Plan: 1 Day Post-Op Procedure(s) (LRB): RIGHT TOTAL KNEE ARTHROPLASTY (Right)   Advance diet Up with therapy  Home today after therapy sessions RTC in 2 weeks Outpt PT already set up Reviewed goals

## 2017-12-08 NOTE — Progress Notes (Signed)
Physical Therapy Treatment Patient Details Name: Krystal James MRN: [id number] DOB: 07-25-1951 Today's Date: 12/08/2017    History of Present Illness R TKA    PT Comments    POD # 1 pm session With spouse present, practiced 4 steps forweard with one rail and one crutch.  Pt has met goals to D/C to home.  RN notified.   Follow Up Recommendations  Follow surgeon's recommendation for DC plan and follow-up therapies     Equipment Recommendations  None recommended by PT    Recommendations for Other Services       Precautions / Restrictions Precautions Precautions: Knee;Fall Restrictions Weight Bearing Restrictions: No    Mobility  Bed Mobility               General bed mobility comments: OOB in recliner  Transfers Overall transfer level: Needs assistance Equipment used: Rolling walker (2 wheeled) Transfers: Sit to/from Stand Sit to Stand: Supervision;Min guard         General transfer comment: increased time and safety with turns  Ambulation/Gait Ambulation/Gait assistance: Supervision;Min guard Ambulation Distance (Feet): 35 Feet Assistive device: Rolling walker (2 wheeled) Gait Pattern/deviations: Step-to pattern;Antalgic Gait velocity: decreased   General Gait Details: cues for sequence and safety with turns   Stairs Stairs: Yes   Stair Management: One rail Left;Forwards;With crutches Number of Stairs: 4 General stair comments: with spouse present for "hands on" instruction on proper tech and safety.  Wheelchair Mobility    Modified Rankin (Stroke Patients Only)       Balance                                            Cognition Arousal/Alertness: Awake/alert Behavior During Therapy: WFL for tasks assessed/performed Overall Cognitive Status: Within Functional Limits for tasks assessed                                        Exercises      General Comments        Pertinent Vitals/Pain Pain  Assessment: 0-10 Pain Score: 3  Pain Location: right knee Pain Descriptors / Indicators: Operative site guarding;Discomfort Pain Intervention(s): Monitored during session;Repositioned;Premedicated before session;Ice applied    Home Living                      Prior Function            PT Goals (current goals can now be found in the care plan section) Progress towards PT goals: Progressing toward goals    Frequency    7X/week      PT Plan Current plan remains appropriate    Co-evaluation              AM-PAC PT "6 Clicks" Daily Activity  Outcome Measure  Difficulty turning over in bed (including adjusting bedclothes, sheets and blankets)?: A Little Difficulty moving from lying on back to sitting on the side of the bed? : A Little Difficulty sitting down on and standing up from a chair with arms (e.g., wheelchair, bedside commode, etc,.)?: A Little Help needed moving to and from a bed to chair (including a wheelchair)?: A Little Help needed walking in hospital room?: A Little Help needed climbing 3-5 steps with a railing? : A Lot 6  Click Score: 17    End of Session Equipment Utilized During Treatment: Gait belt Activity Tolerance: Patient tolerated treatment well Patient left: in chair;with call bell/phone within reach;with family/visitor present Nurse Communication: Mobility status PT Visit Diagnosis: Unsteadiness on feet (R26.81);Pain Pain - Right/Left: Right Pain - part of body: Knee     Time: 4585-9292 PT Time Calculation (min) (ACUTE ONLY): 18 min  Charges:  $Gait Training: 8-22 mins $Therapeutic Activity: 8-22 mins                    G Codes:       Krystal James  PTA [facility]  Acute  Rehab Pager      [phone]

## 2017-12-08 NOTE — Progress Notes (Signed)
Discharge instructions reviewed with patient and family member. Any questions or concerns were addressed.

## 2017-12-10 ENCOUNTER — Ambulatory Visit: Payer: 59 | Attending: Orthopedic Surgery | Admitting: Physical Therapy

## 2017-12-10 ENCOUNTER — Encounter: Payer: Self-pay | Admitting: Physical Therapy

## 2017-12-10 DIAGNOSIS — M25661 Stiffness of right knee, not elsewhere classified: Secondary | ICD-10-CM | POA: Diagnosis present

## 2017-12-10 DIAGNOSIS — M25561 Pain in right knee: Secondary | ICD-10-CM | POA: Diagnosis present

## 2017-12-10 DIAGNOSIS — R2689 Other abnormalities of gait and mobility: Secondary | ICD-10-CM | POA: Diagnosis present

## 2017-12-10 DIAGNOSIS — M6281 Muscle weakness (generalized): Secondary | ICD-10-CM | POA: Diagnosis present

## 2017-12-10 DIAGNOSIS — R262 Difficulty in walking, not elsewhere classified: Secondary | ICD-10-CM | POA: Insufficient documentation

## 2017-12-10 NOTE — Therapy (Signed)
[facility] [place] [address]  [address] [place], [place], [address] Phone: [phone]   Fax:  [phone]  Physical Therapy Evaluation  Patient Details  Name: Krystal James MRN: [id number] Date of Birth: [birth year] Referring Provider: Dr. Charlann Boxerlin   Encounter Date: 12/10/2017  PT End of Session - 12/10/17 1222    Visit Number  1    Number of Visits  10    Date for PT Re-Evaluation  01/14/18    PT Start Time  1100    PT Stop Time  1150    PT Time Calculation (min)  50 min    Activity Tolerance  Patient tolerated treatment well    Behavior During Therapy  [facility] for tasks assessed/performed       Past Medical History:  Diagnosis Date  . Diverticulosis of colon   . GERD (gastroesophageal reflux disease)   . Hiatal hernia   . Hyperlipidemia   . Hypertension   . Nocturia more than twice per night   . OA (osteoarthritis)    right and left knee,  hands  . OA (osteoarthritis) of knee    right and left,    . PONV (postoperative nausea and vomiting)   . Type 2 diabetes mellitus (HCC)    followed by pcp  . Wears glasses     Past Surgical History:  Procedure Laterality Date  . APPENDECTOMY  age 67  . BREAST REDUCTION SURGERY Bilateral 1994  . COLONOSCOPY  2018  . ESOPHAGOGASTRODUODENOSCOPY  2017  . KNEE ARTHROSCOPY Right 2014   dr Charlann Boxerolin  . SHOULDER OPEN ROTATOR CUFF REPAIR Right 10-21-2001  [facility]  . TOTAL KNEE ARTHROPLASTY Right 12/07/2017   Procedure: RIGHT TOTAL KNEE ARTHROPLASTY;  Surgeon: Durene Romanslin, Matthew, MD;  Location: [facility];  Service: Orthopedics;  Laterality: Right;  90 mins  . TUBAL LIGATION Bilateral yrs ago    There were no vitals filed for this visit.   Subjective Assessment - 12/10/17 1101    Subjective  patient s/p R TKA on 12/07/17 - prior to surgery using SPC all the time. Plans for L TKA on 01/12/18. 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     Currently in Pain?  Yes    Pain Score  8     Pain Location  Knee    Pain Orientation  Right    Pain Descriptors / Indicators  Aching;Sore;Discomfort    Pain Type  Acute pain;Surgical pain    Pain Relieving Factors  ice         [facility] PT Assessment - 12/10/17 1106      Assessment   Medical Diagnosis  R TKA    Referring Provider  Dr. Charlann Boxerlin    Onset Date/Surgical Date  12/07/17    Next MD Visit  12/23/17    Prior Therapy  no      Precautions   Precautions  None      Restrictions   Weight Bearing Restrictions  No      Balance Screen   Has the patient fallen in the past 6 months  No    Has the patient had a decrease in activity level because of a fear of falling?  No    Is the patient reluctant to leave their home because of a fear of falling?   No      Home Nurse, mental health  Private residence    Living Arrangements  Spouse/significant other    Type of Home  House    Home Access  Stairs to enter    Entrance Stairs-Number of Steps  4    Entrance Stairs-Rails  Right    Home Layout  One level    Home Equipment  Walker - 2 wheels;Cane - single point;Bedside commode    Additional Comments  currently sleepin in recliner      Prior Function   Level of Independence  Independent    Vocation  Full time employment    Vocation Requirements  desk work      Cognition   Overall Cognitive Status  Within Functional Limits for tasks assessed      Observation/Other Assessments   Focus on Therapeutic Outcomes (FOTO)   Knee: 72% limited, predicted 44% limited      Sensation   Light Touch  Appears Intact      Coordination   Gross Motor Movements are Fluid and Coordinated  Yes      ROM / Strength   AROM / PROM / Strength  AROM;PROM;Strength      AROM   AROM Assessment Site  Knee    Right/Left Knee  Right;Left    Right Knee Extension  8     Right Knee Flexion  74      PROM   PROM Assessment Site  Knee    Right/Left Knee  Right;Left    Right Knee Extension  8    Right Knee Flexion  80      Strength   Overall Strength Comments  L LE grossly 4/5; R LE grossly 3-/5    Strength Assessment Site  Hip;Knee    Right/Left Hip  Right;Left    Right/Left Knee  Right;Left      Palpation   Patella mobility  will assess at upcoming visits      Ambulation/Gait   Ambulation/Gait  Yes    Ambulation/Gait Assistance  6: Modified independent (Device/Increase time);5: Supervision    Ambulation Distance (Feet)  -- within treatment room    Assistive device  Rolling walker    Gait Pattern  Step-to pattern;Decreased stance time - right;Decreased step length - left;Decreased hip/knee flexion - right;Decreased dorsiflexion - right;Decreased weight shift to right;Antalgic    Ambulation Surface  Level;Indoor    Gait velocity  decreased                Objective measurements completed on examination: See above findings.      [facility] Adult PT Treatment/Exercise - 12/10/17 1106      Exercises   Exercises  Knee/Hip      Knee/Hip Exercises: Seated   Long Arc Quad  AROM;Right;10 reps    Heel Slides  AAROM;Right;5 reps      Knee/Hip Exercises: Supine   Quad Sets  Right;5 sets    Quad Sets Limitations  poor activation    Heel Slides  AAROM;Right;5 reps             PT Education - 12/10/17 1219    Education provided  Yes    Education Details  exam findings, POC, HEP    Person(s) Educated  Patient    Methods  Explanation;Demonstration;Handout    Comprehension  Verbalized understanding;Returned demonstration  PT Short Term Goals - 12/10/17 1229      PT SHORT TERM GOAL #1   Title  patient to be independent with initial HEP    Status  New    Target Date  12/24/17      PT SHORT TERM GOAL #2   Title  patient to demonstrate R knee PROM 0 - 100    Status  New    Target Date  12/24/17        PT Long Term Goals -  12/10/17 1230      PT LONG TERM GOAL #1   Title  patient to be independent with advanced HEP    Status  New    Target Date  01/14/18      PT LONG TERM GOAL #2   Title  patient to improve R knee AROM 0-100    Status  New    Target Date  01/14/18      PT LONG TERM GOAL #3   Title  patient to demonstrate heel toe gait mechanics without evidence of instability with LRAD    Status  New    Target Date  01/14/18      PT LONG TERM GOAL #4   Title  patient to demonstrate R LE SLR without quad lag    Status  New    Target Date  01/14/18      PT LONG TERM GOAL #5   Title  patient to demosntrate reciprocal stair navigation with single hand rail up/down 4 steps    Status  New    Target Date  01/14/18             Plan - 12/10/17 1223    Clinical Impression Statement  Mrs. Dawkins is a pleasant 67 y/o female s/p R TKA on 12/07/17. Patient today with limited tolerance to gait with patient presenting in wheelchair - able to walk short distance in treatment room with RW with poor weight acceptance onto R LE - largely in part due to pain. Patient also with expected deficits in strength and ROM s/p TKA. PT to plan to initiate POC at 2x/wk for 4 weeks as patient has hard cap of 20 visits per year and planned L TKA on 01/12/18. Patient to beneift from skilled PT intervention to address the above listed deficits to allow for improved functional mobility and general QOL.     Clinical Presentation  Stable    Clinical Decision Making  Low    Rehab Potential  Good    PT Frequency  3x / week    PT Duration  4 weeks    PT Treatment/Interventions  ADLs/Self Care Home Management;Cryotherapy;Electrical Stimulation;Moist Heat;Therapeutic exercise;Therapeutic activities;Functional mobility training;Stair training;Gait training;DME Instruction;Balance training;Neuromuscular re-education;Patient/family education;Manual techniques;Vasopneumatic Device;Passive range of motion    Consulted and Agree with Plan of Care   Patient       Patient will benefit from skilled therapeutic intervention in order to improve the following deficits and impairments:  Abnormal gait, Decreased activity tolerance, Decreased balance, Decreased range of motion, Decreased mobility, Difficulty walking, Pain, Decreased strength  Visit Diagnosis: Acute pain of right knee  Stiffness of right knee, not elsewhere classified  Difficulty in walking, not elsewhere classified  Muscle weakness (generalized)  Other abnormalities of gait and mobility     Problem List Patient Active Problem List   Diagnosis Date Noted  . 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  [address] [place], [place], [address] Phone: [phone]   Fax:  [phone]  Name: Krystal James MRN: [id number] Date of Birth: [birth year]

## 2017-12-10 NOTE — Discharge Summary (Signed)
Physician Discharge Summary  Patient ID: Krystal James MRN: [id number] DOB/AGE: 1951-04-24 67 y.o.  Admit date: 12/07/2017 Discharge date: 12/08/2017   Procedures:  Procedure(s) (LRB): RIGHT TOTAL KNEE ARTHROPLASTY (Right)  Attending Physician:  Dr. Durene RomansMatthew Olin   Admission Diagnoses:   Right knee primary OA / pain  Discharge Diagnoses:  Principal Problem:   S/P right TKA Active Problems:   S/P total knee replacement  Past Medical History:  Diagnosis Date  . Diverticulosis of colon   . GERD (gastroesophageal reflux disease)   . Hiatal hernia   . Hyperlipidemia   . Hypertension   . Nocturia more than twice per night   . OA (osteoarthritis)    right and left knee,  hands  . OA (osteoarthritis) of knee    right and left,    . PONV (postoperative nausea and vomiting)   . Type 2 diabetes mellitus (HCC)    followed by pcp  . Wears glasses     HPI:    Krystal James, 67 y.o. female, has a history of pain and functional disability in the right knee due to arthritis and has failed non-surgical conservative treatments for greater than 12 weeks to includeNSAID's and/or analgesics, corticosteriod injections, viscosupplementation injections and activity modification.  Onset of symptoms was gradual, starting ~1 years ago with gradually worsening course since that time. The patient noted prior procedures on the knee to include  arthroscopy on the right knee(s).  Patient currently rates pain in the right knee(s) at 9 out of 10 with activity. Patient has night pain, worsening of pain with activity and weight bearing, pain that interferes with activities of daily living, pain with passive range of motion, crepitus and joint swelling.  Patient has evidence of periarticular osteophytes and joint space narrowing by imaging studies. There is no active infection.  Risks, benefits and expectations were discussed with the patient.  Risks including but not limited to the risk of anesthesia, blood  clots, nerve damage, blood vessel damage, failure of the prosthesis, infection and up to and including death.  Patient understand the risks, benefits and expectations and wishes to proceed with surgery.  PCP: Angelica ChessmanAguiar, Rafaela M, MD   Discharged Condition: good  Hospital Course:  Patient underwent the above stated procedure on 12/07/2017. Patient tolerated the procedure well and brought to the recovery room in good condition and subsequently to the floor.  POD #1 BP: 138/62 ; Pulse: 79 ; Temp: 97.7 F (36.5 C) ; Resp: 15 Patient reports pain as moderate.  Up this am eating breakfast in chair.  Feeling more discomfort at the moment.  No events overnight however. Neurovascular intact and incision: dressing C/D/I.   LABS  Basename    HGB     9.8  HCT     28.7    Discharge Exam: General appearance: alert, cooperative and no distress Extremities: Homans sign is negative, no sign of DVT, no edema, redness or tenderness in the calves or thighs and no ulcers, gangrene or trophic changes  Disposition:  Home with follow up in 2 weeks   Follow-up Information    Durene Romanslin, Anniah Glick, MD. Schedule an appointment as soon as possible for a visit in 2 weeks.   Specialty:  Orthopedic Surgery Contact information: [address] [place] [address] [phone]           Discharge Instructions    Call MD / Call 911   Complete by:  As directed    If you  experience chest pain or shortness of breath, CALL 911 and be transported to the hospital emergency room.  If you develope a fever above 101 F, pus (white drainage) or increased drainage or redness at the wound, or calf pain, call your surgeon's office.   Change dressing   Complete by:  As directed    Maintain surgical dressing until follow up in the clinic. If the edges start to pull up, may reinforce with tape. If the dressing is no longer working, may remove and cover with gauze and tape, but must keep the area dry and clean.  Call  with any questions or concerns.   Constipation Prevention   Complete by:  As directed    Drink plenty of fluids.  Prune juice may be helpful.  You may use a stool softener, such as Colace (over the counter) 100 mg twice a day.  Use MiraLax (over the counter) for constipation as needed.   Diet - low sodium heart healthy   Complete by:  As directed    Discharge instructions   Complete by:  As directed    Maintain surgical dressing until follow up in the clinic. If the edges start to pull up, may reinforce with tape. If the dressing is no longer working, may remove and cover with gauze and tape, but must keep the area dry and clean.  Follow up in 2 weeks at [facility]. Call with any questions or concerns.   Increase activity slowly as tolerated   Complete by:  As directed    Weight bearing as tolerated with assist device (walker, cane, etc) as directed, use it as long as suggested by your surgeon or therapist, typically at least 4-6 weeks.   TED hose   Complete by:  As directed    Use stockings (TED hose) for 2 weeks on both leg(s).  You may remove them at night for sleeping.      Allergies as of 12/08/2017      Reactions   Sulfa Antibiotics Hives      Medication List    STOP taking these medications   aspirin EC 81 MG tablet Replaced by:  aspirin 81 MG chewable tablet   aspirin-acetaminophen-caffeine 250-250-65 MG tablet Commonly known as:  EXCEDRIN MIGRAINE   VOLTAREN 1 % Gel Generic drug:  diclofenac sodium     TAKE these medications   amLODipine 10 MG tablet Commonly known as:  NORVASC Take 10 mg by mouth daily. In the morning.   aspirin 81 MG chewable tablet Commonly known as:  ASPIRIN CHILDRENS Chew 1 tablet (81 mg total) by mouth 2 (two) times daily. Take for 4 weeks, then resume regular dose. Replaces:  aspirin EC 81 MG tablet   docusate sodium 100 MG capsule Commonly known as:  COLACE Take 1 capsule (100 mg total) by mouth 2 (two) times daily.     esomeprazole 20 MG capsule Commonly known as:  NEXIUM Take 20 mg by mouth daily before breakfast.   fenofibrate 160 MG tablet Take 160 mg by mouth daily.   ferrous sulfate 325 (65 FE) MG tablet Commonly known as:  FERROUSUL Take 1 tablet (325 mg total) by mouth 3 (three) times daily with meals.   HYDROcodone-acetaminophen 7.5-325 MG tablet Commonly known as:  NORCO Take 1-2 tablets by mouth every 4 (four) hours as needed for moderate pain.   lisinopril 5 MG tablet Commonly known as:  PRINIVIL,ZESTRIL Take 5 mg by mouth daily.   metFORMIN 500 MG tablet Commonly  known as:  GLUCOPHAGE Take 500 mg by mouth 3 (three) times daily with meals.   methocarbamol 500 MG tablet Commonly known as:  ROBAXIN Take 1 tablet (500 mg total) by mouth every 6 (six) hours as needed for muscle spasms.   polyethylene glycol packet Commonly known as:  MIRALAX / GLYCOLAX Take 17 g by mouth 2 (two) times daily.   rosuvastatin 10 MG tablet Commonly known as:  CRESTOR Take 10 mg by mouth daily with lunch.            Discharge Care Instructions  (From admission, onward)        Start     Ordered   12/08/17 0000  Change dressing    Comments:  Maintain surgical dressing until follow up in the clinic. If the edges start to pull up, may reinforce with tape. If the dressing is no longer working, may remove and cover with gauze and tape, but must keep the area dry and clean.  Call with any questions or concerns.   12/08/17 1610       Signed: Anastasio Auerbach. Guyla Bless   PA-C  12/10/2017, 8:15 AM

## 2017-12-10 NOTE — Patient Instructions (Signed)
Quad Set   With other leg bent, foot flat, slowly tighten muscles on thigh of straight leg while counting out loud to __5__. Repeat with other leg. Repeat __10-15__ times. Do __2-3__ sessions per day.  Heel Slide   Bend knee and pull heel toward buttocks. Hold __5__ seconds. Return. Repeat with other knee. Repeat __10-15__ times. Do __2__ sessions per day.  Strengthening: Straight Leg Raise (Phase 1)   Tighten muscles on front of right thigh, then lift leg _~8___ inches from surface, keeping knee locked.  Repeat __10-15__ times per set. Do __2__ sets per session.   Long [employer] Quad   Straighten operated leg and try to hold it _3-5___ seconds. Use _0___ lbs on ankle. Repeat _10-15___ times. Do __2__ sessions a day.  Seated knee flexion  Place good heel in front of surgical leg and pull back to bend knee - hold 5-10 sec

## 2017-12-14 ENCOUNTER — Ambulatory Visit: Payer: 59

## 2017-12-16 ENCOUNTER — Encounter: Payer: Self-pay | Admitting: Physical Therapy

## 2017-12-16 ENCOUNTER — Ambulatory Visit: Payer: 59 | Admitting: Physical Therapy

## 2017-12-16 DIAGNOSIS — M25661 Stiffness of right knee, not elsewhere classified: Secondary | ICD-10-CM

## 2017-12-16 DIAGNOSIS — R2689 Other abnormalities of gait and mobility: Secondary | ICD-10-CM

## 2017-12-16 DIAGNOSIS — M6281 Muscle weakness (generalized): Secondary | ICD-10-CM

## 2017-12-16 DIAGNOSIS — R262 Difficulty in walking, not elsewhere classified: Secondary | ICD-10-CM

## 2017-12-16 DIAGNOSIS — M25561 Pain in right knee: Secondary | ICD-10-CM

## 2017-12-16 NOTE — Therapy (Signed)
[facility] [address]  [address] [place], [place], [address] Phone: [phone]   Fax:  [phone]  Physical Therapy Treatment  Patient Details  Name: Krystal James MRN: [id number] Date of Birth: 12/18/1950 Referring Provider: Dr. Charlann Boxerlin   Encounter Date: 12/16/2017  PT End of Session - 12/16/17 1405    Visit Number  2    Number of Visits  10    Date for PT Re-Evaluation  01/14/18    PT Start Time  1401    PT Stop Time  1444    PT Time Calculation (min)  43 min    Activity Tolerance  Patient tolerated treatment well    Behavior During Therapy  [facility] for tasks assessed/performed       Past Medical History:  Diagnosis Date  . Diverticulosis of colon   . GERD (gastroesophageal reflux disease)   . Hiatal hernia   . Hyperlipidemia   . Hypertension   . Nocturia more than twice per night   . OA (osteoarthritis)    right and left knee,  hands  . OA (osteoarthritis) of knee    right and left,    . PONV (postoperative nausea and vomiting)   . Type 2 diabetes mellitus (HCC)    followed by pcp  . Wears glasses     Past Surgical History:  Procedure Laterality Date  . APPENDECTOMY  age 67  . BREAST REDUCTION SURGERY Bilateral 1994  . COLONOSCOPY  2018  . ESOPHAGOGASTRODUODENOSCOPY  2017  . KNEE ARTHROSCOPY Right 2014   dr Charlann Boxerolin  . SHOULDER OPEN ROTATOR CUFF REPAIR Right 10-21-2001  [facility]  . TOTAL KNEE ARTHROPLASTY Right 12/07/2017   Procedure: RIGHT TOTAL KNEE ARTHROPLASTY;  Surgeon: Durene Romanslin, Matthew, MD;  Location: [facility];  Service: Orthopedics;  Laterality: Right;  90 mins  . TUBAL LIGATION Bilateral yrs ago    There were no vitals filed for this visit.  Subjective Assessment - 12/16/17 1404    Subjective  feels like she is moving better - more tolerable to walking; doing HEP - but painful    Patient is accompained by:  Family member husband    Pertinent History  DM, HTN    Patient Stated Goals  improve  pain.     Currently in Pain?  Yes    Pain Score  4     Pain Location  Knee    Pain Orientation  Right    Pain Descriptors / Indicators  Aching;Sore;Tightness    Pain Type  Acute pain;Surgical pain         [facility] PT Assessment - 12/16/17 0001      AROM   AROM Assessment Site  Knee    Right/Left Knee  Right    Right Knee Extension  4    Right Knee Flexion  93                   [facility] Adult PT Treatment/Exercise - 12/16/17 0001      Ambulation/Gait   Ambulation/Gait  Yes    Ambulation/Gait Assistance  6: Modified independent (Device/Increase time)    Ambulation Distance (Feet)  100 Feet    Assistive device  Straight cane    Gait Pattern  Step-through pattern;Decreased stance time - right;Decreased step length - left;Decreased hip/knee flexion - right;Decreased weight shift to right    Ambulation Surface  Level;Indoor    Gait Comments  initiated gait training with SPC - not  ready to transition as of yet      Knee/Hip Exercises: Aerobic   Recumbent Bike  no resistance for ROM x 6 min - partial revolutions      Knee/Hip Exercises: Standing   Heel Raises  Both;10 reps at walker    Terminal Knee Extension  Right;10 reps ball against wall    Other Standing Knee Exercises  toe clears to 6" step x 10, 8" step x 10      Knee/Hip Exercises: Seated   Long Arc Quad  AROM;Right;10 reps    Other Seated Knee/Hip Exercises  fitter - R LE - 1 blue band x 10 reps    Hamstring Curl  Right;10 reps red tband      Knee/Hip Exercises: Supine   Heel Slides  AROM;Right;10 reps    Straight Leg Raises  Strengthening;Right;10 reps               PT Short Term Goals - 12/16/17 1406      PT SHORT TERM GOAL #1   Title  patient to be independent with initial HEP    Status  On-going      PT SHORT TERM GOAL #2   Title  patient to demonstrate R knee PROM 0 - 100    Status  On-going        PT Long Term Goals - 12/16/17 1406      PT LONG TERM GOAL #1   Title  patient to be  independent with advanced HEP    Status  On-going      PT LONG TERM GOAL #2   Title  patient to improve R knee AROM 0-100    Status  On-going      PT LONG TERM GOAL #3   Title  patient to demonstrate heel toe gait mechanics without evidence of instability with LRAD    Status  On-going      PT LONG TERM GOAL #4   Title  patient to demonstrate R LE SLR without quad lag    Status  On-going      PT LONG TERM GOAL #5   Title  patient to demosntrate reciprocal stair navigation with single hand rail up/down 4 steps    Status  On-going            Plan - 12/16/17 1406    Clinical Impression Statement  Krystal James doing well today - much more tolerable to all activities but does require intermittent rest breaks due to nausea - patient did not eat since 8 am. Patient making excellent progress with AROM, strength and general gait mechanics since initial visit. Will continue to progress towards goals.     PT Treatment/Interventions  ADLs/Self Care Home Management;Cryotherapy;Electrical Stimulation;Moist Heat;Therapeutic exercise;Therapeutic activities;Functional mobility training;Stair training;Gait training;DME Instruction;Balance training;Neuromuscular re-education;Patient/family education;Manual techniques;Vasopneumatic Device;Passive range of motion    Consulted and Agree with Plan of Care  Patient       Patient will benefit from skilled therapeutic intervention in order to improve the following deficits and impairments:  Abnormal gait, Decreased activity tolerance, Decreased balance, Decreased range of motion, Decreased mobility, Difficulty walking, Pain, Decreased strength  Visit Diagnosis: Acute pain of right knee  Stiffness of right knee, not elsewhere classified  Difficulty in walking, not elsewhere classified  Muscle weakness (generalized)  Other abnormalities of gait and mobility     Problem List Patient Active Problem List   Diagnosis Date Noted  . S/P right TKA  12/07/2017  . S/P total knee replacement  12/07/2017     Kipp Laurence, PT, DPT 12/16/17 3:26 PM   [facility] [address]  [address] [place], [place], [address] Phone: [phone]   Fax:  [phone]  Name: Krystal James MRN: [id number] Date of Birth: [birth year]

## 2017-12-16 NOTE — Patient Instructions (Signed)
Heel Raise: Bilateral (Standing)    Rise on balls of feet. Repeat __10-15__ times per set. Do __2__ sets per session.   Hamstring Curl: Resisted (Sitting)    Facing anchor with tubing on right ankle, leg straight out, bend knee. Repeat _10-15___ times per set. Do _2___ sets per session.

## 2017-12-17 ENCOUNTER — Ambulatory Visit: Payer: 59

## 2017-12-17 DIAGNOSIS — R262 Difficulty in walking, not elsewhere classified: Secondary | ICD-10-CM

## 2017-12-17 DIAGNOSIS — M25561 Pain in right knee: Secondary | ICD-10-CM | POA: Diagnosis not present

## 2017-12-17 DIAGNOSIS — M25661 Stiffness of right knee, not elsewhere classified: Secondary | ICD-10-CM

## 2017-12-17 DIAGNOSIS — R2689 Other abnormalities of gait and mobility: Secondary | ICD-10-CM

## 2017-12-17 DIAGNOSIS — M6281 Muscle weakness (generalized): Secondary | ICD-10-CM

## 2017-12-17 NOTE — Therapy (Signed)
[facility] [address]  [address] [place], [place], [address] Phone: [phone]   Fax:  [phone]  Physical Therapy Treatment  Patient Details  Name: Krystal James MRN: [id number] Date of Birth: [birth year] Referring Provider: Dr. Charlann Boxer   Encounter Date: 12/17/2017  PT End of Session - 12/17/17 1542    Visit Number  3    Number of Visits  10    Date for PT Re-Evaluation  01/14/18    PT Start Time  1535    PT Stop Time  1616    PT Time Calculation (min)  41 min    Activity Tolerance  Patient tolerated treatment well    Behavior During Therapy  [facility] for tasks assessed/performed       Past Medical History:  Diagnosis Date  . Diverticulosis of colon   . GERD (gastroesophageal reflux disease)   . Hiatal hernia   . Hyperlipidemia   . Hypertension   . Nocturia more than twice per night   . OA (osteoarthritis)    right and left knee,  hands  . OA (osteoarthritis) of knee    right and left,    . PONV (postoperative nausea and vomiting)   . Type 2 diabetes mellitus (HCC)    followed by pcp  . Wears glasses     Past Surgical History:  Procedure Laterality Date  . APPENDECTOMY  age 32  . BREAST REDUCTION SURGERY Bilateral 1994  . COLONOSCOPY  2018  . ESOPHAGOGASTRODUODENOSCOPY  2017  . KNEE ARTHROSCOPY Right 2014   dr Charlann Boxer  . SHOULDER OPEN ROTATOR CUFF REPAIR Right 10-21-2001  dr Leslee Home [facility]  . 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 TUBAL LIGATION Bilateral yrs ago    There were no vitals filed for this visit.  Subjective Assessment - 12/17/17 1539    Subjective  Reporting some soreness after yesterday's visit.      Patient is accompained by:  Family member husband     Pertinent History  DM, HTN    Patient Stated Goals  improve pain.     Currently in Pain?  Yes     Pain Score  5     Pain Location  Knee    Pain Orientation  Right    Pain Descriptors / Indicators  Aching;Sore;Tightness    Pain Type  Acute pain;Surgical pain    Pain Frequency  Intermittent    Multiple Pain Sites  No                       [facility] Adult PT Treatment/Exercise - 12/17/17 1550      Knee/Hip Exercises: Aerobic   Recumbent Bike  no resistance for ROM x 6 min - partial revolutions near full revoluation      Knee/Hip Exercises: Standing   Heel Raises  Both;10 reps    Heel Raises Limitations  in RW    Terminal Knee Extension  Right x 12 reps     Theraband Level (Terminal Knee Extension)  Level 2 (Red)    Terminal Knee Extension Limitations  in RW    Functional Squat  10 reps    Functional Squat Limitations  mini - in RW      Knee/Hip Exercises: Seated   Long Arc Quad  AROM;Right x 12 reps  Other Seated Knee/Hip Exercises  fitter - R LE - 2 blue band x 10 reps    Hamstring Curl  Right;10 reps 3" hold      Knee/Hip Exercises: Supine   Quad Sets  Right;10 reps    Quad Sets Limitations  into pillow  poor activation     Straight Leg Raises  Strengthening;Right x 12 reps                PT Short Term Goals - 12/16/17 1406      PT SHORT TERM GOAL #1   Title  patient to be independent with initial HEP    Status  On-going      PT SHORT TERM GOAL #2   Title  patient to demonstrate R knee PROM 0 - 100    Status  On-going        PT Long Term Goals - 12/16/17 1406      PT LONG TERM GOAL #1   Title  patient to be independent with advanced HEP    Status  On-going      PT LONG TERM GOAL #2   Title  patient to improve R knee AROM 0-100    Status  On-going      PT LONG TERM GOAL #3   Title  patient to demonstrate heel toe gait mechanics without evidence of instability with LRAD    Status  On-going      PT LONG TERM GOAL #4   Title  patient to demonstrate R LE SLR without quad lag    Status  On-going      PT LONG TERM GOAL #5    Title  patient to demosntrate reciprocal stair navigation with single hand rail up/down 4 steps    Status  On-going            Plan - 12/17/17 1542    Clinical Impression Statement  Pt. reporting increased soreness yesterday night however this has improved today.  Tolerated mild progression in LE strengthening and ROM activities today focused on improving quad control.  Pt. still demonstrating limited quad control with SLR however improved control with vc/tc today with therex.  Ended treatment with pt. declining ice reporting plans to ice knee at home.  Will continue to progress at home.        PT Treatment/Interventions  ADLs/Self Care Home Management;Cryotherapy;Electrical Stimulation;Moist Heat;Therapeutic exercise;Therapeutic activities;Functional mobility training;Stair training;Gait training;DME Instruction;Balance training;Neuromuscular re-education;Patient/family education;Manual techniques;Vasopneumatic Device;Passive range of motion    Consulted and Agree with Plan of Care  Patient       Patient will benefit from skilled therapeutic intervention in order to improve the following deficits and impairments:  Abnormal gait, Decreased activity tolerance, Decreased balance, Decreased range of motion, Decreased mobility, Difficulty walking, Pain, Decreased strength  Visit Diagnosis: Acute pain of right knee  Stiffness of right knee, not elsewhere classified  Difficulty in walking, not elsewhere classified  Muscle weakness (generalized)  Other abnormalities of gait and mobility     Problem List Patient Active Problem List   Diagnosis Date Noted  . 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 S/P total knee replacement 12/07/2017    Kermit BaloMicah Leahmarie Gasiorowski, PTA 12/17/17 5:28 PM  [facility] [place] [address]  [address] [place], [place], [address] Phone: [phone]   Fax:  [phone]  Name: Krystal James MRN: [id number] Date of Birth:  05/21/1951

## 2017-12-22 ENCOUNTER — Encounter: Payer: Self-pay | Admitting: Physical Therapy

## 2017-12-22 ENCOUNTER — Ambulatory Visit: Payer: 59 | Admitting: Physical Therapy

## 2017-12-22 DIAGNOSIS — M25561 Pain in right knee: Secondary | ICD-10-CM

## 2017-12-22 DIAGNOSIS — R262 Difficulty in walking, not elsewhere classified: Secondary | ICD-10-CM

## 2017-12-22 DIAGNOSIS — R2689 Other abnormalities of gait and mobility: Secondary | ICD-10-CM

## 2017-12-22 DIAGNOSIS — M6281 Muscle weakness (generalized): Secondary | ICD-10-CM

## 2017-12-22 DIAGNOSIS — M25661 Stiffness of right knee, not elsewhere classified: Secondary | ICD-10-CM

## 2017-12-22 NOTE — Patient Instructions (Signed)
Terminal Knee Extension (Standing)    Facing anchor with right knee slightly bent and tubing just above knee, gently pull knee back straight. Do not overextend knee. Repeat __10-15__ times per set. Do __2__ sets per session.

## 2017-12-22 NOTE — Therapy (Signed)
[facility] [address]  [address] [place], [place], [address] Phone: [phone]   Fax:  [phone]  Physical Therapy Treatment  Patient Details  Name: Krystal James MRN: [id number] Date of Birth: 11-Jun-1951 Referring Provider: Dr. Charlann Boxer   Encounter Date: 12/22/2017  PT End of Session - 12/22/17 1619    Visit Number  4    Number of Visits  10    Date for PT Re-Evaluation  01/14/18    PT Start Time  1615    PT Stop Time  1658    PT Time Calculation (min)  43 min    Activity Tolerance  Patient tolerated treatment well    Behavior During Therapy  [facility] for tasks assessed/performed       Past Medical History:  Diagnosis Date  . Diverticulosis of colon   . GERD (gastroesophageal reflux disease)   . Hiatal hernia   . Hyperlipidemia   . Hypertension   . Nocturia more than twice per night   . OA (osteoarthritis)    right and left knee,  hands  . OA (osteoarthritis) of knee    right and left,    . PONV (postoperative nausea and vomiting)   . Type 2 diabetes mellitus (HCC)    followed by pcp  . Wears glasses     Past Surgical History:  Procedure Laterality Date  . APPENDECTOMY  age 73  . BREAST REDUCTION SURGERY Bilateral 1994  . COLONOSCOPY  2018  . ESOPHAGOGASTRODUODENOSCOPY  2017  . KNEE ARTHROSCOPY Right 2014   dr Charlann Boxer  . SHOULDER OPEN ROTATOR CUFF REPAIR Right 10-21-2001  dr Leslee Home [facility]  . 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 TUBAL LIGATION Bilateral yrs ago    There were no vitals filed for this visit.  Subjective Assessment - 12/22/17 1619    Subjective  main complaint is poor sleeping patterns    Patient is accompained by:  Family member husband    Pertinent History  DM, HTN    Patient Stated Goals  improve pain.     Currently in Pain?  Yes    Pain  Score  3     Pain Location  Knee    Pain Orientation  Right    Pain Descriptors / Indicators  Aching;Sore    Pain Type  Acute pain;Surgical pain         [facility] PT Assessment - 12/22/17 1620      Assessment   Medical Diagnosis  R TKA    Referring Provider  Dr. Charlann Boxer    Onset Date/Surgical Date  12/07/17    Next MD Visit  12/23/17      AROM   AROM Assessment Site  Knee    Right/Left Knee  Right    Right Knee Extension  4    Right Knee Flexion  100                   [facility] Adult PT Treatment/Exercise - 12/22/17 1620      Ambulation/Gait   Ambulation/Gait  Yes    Ambulation/Gait Assistance  6: Modified independent (Device/Increase time)    Ambulation Distance (Feet)  100 Feet    Assistive device  Straight cane    Gait Pattern  Step-through pattern;Decreased stance time - right;Decreased step length - left;Decreased hip/knee  flexion - right;Decreased weight shift to right    Ambulation Surface  Level;Indoor    Gait Comments  continued gait training with SPC - educated on reducing use of RW and transitioning to [facility]      Exercises   Exercises  Knee/Hip      Knee/Hip Exercises: Stretches   Gastroc Stretch  Right;3 reps;30 seconds    Gastroc Stretch Limitations  prostretch      Knee/Hip Exercises: Aerobic   Recumbent Bike  no resistance - full revolutions fwd/bwd x 6 min      Knee/Hip Exercises: Machines for Strengthening   Cybex Knee Extension  B LE - 15# x 5, 10# x 5    Cybex Knee Flexion  B LE - 15# x 15 reps      Knee/Hip Exercises: Standing   Terminal Knee Extension  Strengthening;Right;10 reps;Theraband    Theraband Level (Terminal Knee Extension)  Level 4 (Blue)    Forward Step Up  Right;15 reps;Hand Hold: 1;Step Height: 4"    Step Down  Right;10 reps;Hand Hold: 2;Step Height: 4" 2 sets of 5; heavy TC from PT    Wall Squat  10 reps mini depth - limited due to L knee pain      Knee/Hip Exercises: Seated   Long Arc Quad  Strengthening;Right;15 reps;Weights     Long Arc Quad Weight  2 lbs.               PT Short Term Goals - 12/16/17 1406      PT SHORT TERM GOAL #1   Title  patient to be independent with initial HEP    Status  On-going      PT SHORT TERM GOAL #2   Title  patient to demonstrate R knee PROM 0 - 100    Status  On-going        PT Long Term Goals - 12/16/17 1406      PT LONG TERM GOAL #1   Title  patient to be independent with advanced HEP    Status  On-going      PT LONG TERM GOAL #2   Title  patient to improve R knee AROM 0-100    Status  On-going      PT LONG TERM GOAL #3   Title  patient to demonstrate heel toe gait mechanics without evidence of instability with LRAD    Status  On-going      PT LONG TERM GOAL #4   Title  patient to demonstrate R LE SLR without quad lag    Status  On-going      PT LONG TERM GOAL #5   Title  patient to demosntrate reciprocal stair navigation with single hand rail up/down 4 steps    Status  On-going            Plan - 12/22/17 1619    Clinical Impression Statement  Krystal James making good progress with AROM and functional mobility since beginning PT. Education on gait with SPC with PT allowing patient to begin to transition to this to promote more normal gait mechanics and upright posture. Continues to do well with progressing AROM. PT session today focusing heavily on strengthening today with some limited toelrance due to fatigue and discomfort, as expected.     PT Treatment/Interventions  ADLs/Self Care Home Management;Cryotherapy;Electrical Stimulation;Moist Heat;Therapeutic exercise;Therapeutic activities;Functional mobility training;Stair training;Gait training;DME Instruction;Balance training;Neuromuscular re-education;Patient/family education;Manual techniques;Vasopneumatic Device;Passive range of motion    Consulted and Agree with Plan of Care  Patient       Patient will benefit from skilled therapeutic intervention in order to improve the following  deficits and impairments:  Abnormal gait, Decreased activity tolerance, Decreased balance, Decreased range of motion, Decreased mobility, Difficulty walking, Pain, Decreased strength  Visit Diagnosis: Acute pain of right knee  Stiffness of right knee, not elsewhere classified  Difficulty in walking, not elsewhere classified  Muscle weakness (generalized)  Other abnormalities of gait and mobility     Problem List Patient Active Problem List   Diagnosis Date Noted  . 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 S/P total knee replacement 12/07/2017     Kipp Laurence, PT, DPT 12/22/17 5:17 PM   [facility] [address]  [address] [place], [place], [address] Phone: [phone]   Fax:  [phone]  Name: Krystal James MRN: [id number] Date of Birth: 1950/10/08

## 2017-12-24 ENCOUNTER — Ambulatory Visit: Payer: 59

## 2017-12-24 DIAGNOSIS — M25561 Pain in right knee: Secondary | ICD-10-CM | POA: Diagnosis not present

## 2017-12-24 DIAGNOSIS — M6281 Muscle weakness (generalized): Secondary | ICD-10-CM

## 2017-12-24 DIAGNOSIS — M25661 Stiffness of right knee, not elsewhere classified: Secondary | ICD-10-CM

## 2017-12-24 DIAGNOSIS — R2689 Other abnormalities of gait and mobility: Secondary | ICD-10-CM

## 2017-12-24 DIAGNOSIS — R262 Difficulty in walking, not elsewhere classified: Secondary | ICD-10-CM

## 2017-12-24 NOTE — Therapy (Signed)
[facility] [address]  [place], [place], [address] Phone: [phone]   Fax:  [phone]  Physical Therapy Treatment  Patient Details  Name: Krystal James MRN: [id number] Date of Birth: 01/22/1951 Referring Provider: Dr. Alvan Dame   Encounter Date: 12/24/2017  PT End of Session - 12/24/17 1621    Visit Number  5    Number of Visits  10    Date for PT Re-Evaluation  01/14/18    PT Start Time  2641    PT Stop Time  1656    PT Time Calculation (min)  41 min    Activity Tolerance  Patient tolerated treatment well    Behavior During Therapy  [facility] for tasks assessed/performed       Past Medical History:  Diagnosis Date  . Diverticulosis of colon   . GERD (gastroesophageal reflux disease)   . Hiatal hernia   . Hyperlipidemia   . Hypertension   . Nocturia more than twice per night   . OA (osteoarthritis)    right and left knee,  hands  . OA (osteoarthritis) of knee    right and left,    . PONV (postoperative nausea and vomiting)   . Type 2 diabetes mellitus ([place])    followed by pcp  . Wears glasses     Past Surgical History:  Procedure Laterality Date  . APPENDECTOMY  age 29  . BREAST REDUCTION SURGERY Bilateral 1994  . COLONOSCOPY  2018  . ESOPHAGOGASTRODUODENOSCOPY  2017  . KNEE ARTHROSCOPY Right 2014   dr Alvan Dame  . SHOULDER OPEN ROTATOR CUFF REPAIR Right 10-21-2001  dr Collier [facility]ina [facility]  . TOTAL KNEE ARTHROPLASTY Right 12/07/2017   Procedure: RIGHT TOTAL KNEE ARTHROPLASTY;  Surgeon: Paralee Cancel, MD;  Location: [facility];  Service: Orthopedics;  Laterality: Right;  90 mins  . TUBAL LIGATION Bilateral yrs ago    There were no vitals filed for this visit.  Subjective Assessment - 12/24/17 1619    Subjective  Pt. reporting MD removed bandage     Pertinent History  DM, HTN    Patient Stated Goals  improve pain.     Currently in Pain?  Yes    Pain Score  2     Pain Location  Knee    Pain Orientation  Right     Pain Descriptors / Indicators  Aching;Sore    Pain Type  Surgical pain;Acute pain    Pain Onset  1 to 4 weeks ago    Pain Frequency  Intermittent    Aggravating Factors   bending knee     Pain Relieving Factors  ice     Multiple Pain Sites  No         [facility] PT Assessment - 12/24/17 0001      PROM   Right/Left Knee  Right    Right Knee Extension  4    Right Knee Flexion  106                   [facility] Adult PT Treatment/Exercise - 12/24/17 1637      Ambulation/Gait   Ambulation/Gait  Yes    Ambulation/Gait Assistance  6: Modified independent (Device/Increase time)    Ambulation Distance (Feet)  90 Feet    Assistive device  Straight cane    Gait Pattern  Step-through pattern;Decreased stance time - right;Decreased step length - left;Decreased hip/knee flexion - right;Decreased weight shift to right    Ambulation  Surface  Level;Indoor    Gait Comments  Focusing on even stance time, step length, and B heel strike; Good carryover with cueing for increased L step length      Knee/Hip Exercises: Aerobic   Recumbent Bike  no resistance - full revolutions fwd x 6 min      Knee/Hip Exercises: Standing   Knee Flexion  Right;10 reps;Strengthening    Knee Flexion Limitations  1#    Hip Flexion  Right;Left;10 reps;Knee straight    Hip Flexion Limitations  chair    Hip Abduction  Right;Left;10 reps;Knee straight    Abduction Limitations  chair     Hip Extension  Right;Left;10 reps;Knee straight    Extension Limitations  chair     Lateral Step Up  10 reps;Right;Left;Step Height: 4";Hand Hold: 2    Lateral Step Up Limitations  2 HH assist from therapist       Knee/Hip Exercises: Supine   Straight Leg Raises  Right;10 reps;Strengthening    Straight Leg Raises Limitations  1# - quad set prior to each movement       Manual Therapy   Manual Therapy  Joint mobilization    Joint Mobilization  R patellar mobility all directions - limited mobility             PT  Education - 12/24/17 1701    Education provided  Yes    Education Details  3-way hip kicker with yellow looped TB at ankles, Hamstring curl     Person(s) Educated  Patient    Methods  Explanation;Demonstration;Verbal cues;Handout    Comprehension  Verbalized understanding;Returned demonstration;Verbal cues required;Need further instruction       PT Short Term Goals - 12/24/17 1621      PT SHORT TERM GOAL #1   Title  patient to be independent with initial HEP    Status  Achieved      PT SHORT TERM GOAL #2   Title  patient to demonstrate R knee PROM 0 - 100    Status  Partially Met R knee 4-106 dg         PT Long Term Goals - 12/16/17 1406      PT LONG TERM GOAL #1   Title  patient to be independent with advanced HEP    Status  On-going      PT LONG TERM GOAL #2   Title  patient to improve R knee AROM 0-100    Status  On-going      PT LONG TERM GOAL #3   Title  patient to demonstrate heel toe gait mechanics without evidence of instability with LRAD    Status  On-going      PT LONG TERM GOAL #4   Title  patient to demonstrate R LE SLR without quad lag    Status  On-going      PT LONG TERM GOAL #5   Title  patient to demosntrate reciprocal stair navigation with single hand rail up/down 4 steps    Status  On-going            Plan - 12/24/17 1623    Clinical Impression Statement  Krystal James reporting MD pleased with R knee motion and removed bandage yesterday at f/u.  Krystal James demonstrating good sequencing with gait training with SPC however did require cueing for upright posture and increased L step length with good carryover.  Tolerated addition of 3-way hip kicker, standing hamstring curl, and lateral step-up well today.  Visible improvement in  quad control with SLR today and able to easily achieve ~ 105 dg flexion.  Ended treatment with pt. reporting low-level knee pain and declining ice.  Pt. will continue to benefit from skilled therapy for improved safety with gait and  functional strengthening.      PT Treatment/Interventions  ADLs/Self Care Home Management;Cryotherapy;Electrical Stimulation;Moist Heat;Therapeutic exercise;Therapeutic activities;Functional mobility training;Stair training;Gait training;DME Instruction;Balance training;Neuromuscular re-education;Patient/family education;Manual techniques;Vasopneumatic Device;Passive range of motion    Consulted and Agree with Plan of Care  Patient       Patient will benefit from skilled therapeutic intervention in order to improve the following deficits and impairments:  Abnormal gait, Decreased activity tolerance, Decreased balance, Decreased range of motion, Decreased mobility, Difficulty walking, Pain, Decreased strength  Visit Diagnosis: Acute pain of right knee  Stiffness of right knee, not elsewhere classified  Difficulty in walking, not elsewhere classified  Muscle weakness (generalized)  Other abnormalities of gait and mobility     Problem List Patient Active Problem List   Diagnosis Date Noted  . 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

## 2017-12-29 ENCOUNTER — Encounter: Payer: Self-pay | Admitting: Physical Therapy

## 2017-12-29 ENCOUNTER — Ambulatory Visit: Payer: 59 | Admitting: Physical Therapy

## 2017-12-29 DIAGNOSIS — M6281 Muscle weakness (generalized): Secondary | ICD-10-CM

## 2017-12-29 DIAGNOSIS — M25561 Pain in right knee: Secondary | ICD-10-CM

## 2017-12-29 DIAGNOSIS — R2689 Other abnormalities of gait and mobility: Secondary | ICD-10-CM

## 2017-12-29 DIAGNOSIS — M25661 Stiffness of right knee, not elsewhere classified: Secondary | ICD-10-CM

## 2017-12-29 DIAGNOSIS — R262 Difficulty in walking, not elsewhere classified: Secondary | ICD-10-CM

## 2017-12-29 NOTE — Therapy (Signed)
[facility] [address]  [place], [place], [address] Phone: [phone]   Fax:  [phone]  Physical Therapy Treatment  Patient Details  Name: Krystal James MRN: [id number] Date of Birth: Jan 15, 1951 Referring Provider: Dr. Alvan Dame   Encounter Date: 12/29/2017  PT End of Session - 12/29/17 1623    Visit Number  6    Number of Visits  10    Date for PT Re-Evaluation  01/14/18    PT Start Time  1620    PT Stop Time  1700    PT Time Calculation (min)  40 min    Activity Tolerance  Patient tolerated treatment well    Behavior During Therapy  [facility] for tasks assessed/performed       Past Medical History:  Diagnosis Date  . Diverticulosis of colon   . GERD (gastroesophageal reflux disease)   . Hiatal hernia   . Hyperlipidemia   . Hypertension   . Nocturia more than twice per night   . OA (osteoarthritis)    right and left knee,  hands  . OA (osteoarthritis) of knee    right and left,    . PONV (postoperative nausea and vomiting)   . Type 2 diabetes mellitus ([place])    followed by pcp  . Wears glasses     Past Surgical History:  Procedure Laterality Date  . APPENDECTOMY  age 54  . BREAST REDUCTION SURGERY Bilateral 1994  . COLONOSCOPY  2018  . ESOPHAGOGASTRODUODENOSCOPY  2017  . KNEE ARTHROSCOPY Right 2014   dr Alvan Dame  . SHOULDER OPEN ROTATOR CUFF REPAIR Right 10-21-2001  dr Collier [facility]  . TOTAL KNEE ARTHROPLASTY Right 12/07/2017   Procedure: RIGHT TOTAL KNEE ARTHROPLASTY;  Surgeon: Paralee Cancel, MD;  Location: [facility];  Service: Orthopedics;  Laterality: Right;  90 mins  . TUBAL LIGATION Bilateral yrs ago    There were no vitals filed for this visit.  Subjective Assessment - 12/29/17 1621    Subjective  doing well - walking with cane some at home - had some nerve pain in knee last night    Patient is accompained by:  Family member husband    Pertinent History  DM, HTN    Patient Stated Goals  improve  pain.     Currently in Pain?  Yes    Pain Score  2     Pain Location  Knee    Pain Orientation  Right    Pain Descriptors / Indicators  Aching;Sore    Pain Type  Acute pain;Surgical pain                       [facility] Adult PT Treatment/Exercise - 12/29/17 1625      Knee/Hip Exercises: Aerobic   Recumbent Bike  L1 x 6 min      Knee/Hip Exercises: Standing   Forward Step Up  Right;15 reps;Hand Hold: 1;Step Height: 6" VC to reduce hip hike/depression    Step Down  Right;15 reps;Hand Hold: 2;Step Height: 4" heavy VC/TC from PT    Wall Squat  10 reps      Knee/Hip Exercises: Seated   Other Seated Knee/Hip Exercises  R LE - fitter - 1 blue/1 [place] x 15    Hamstring Curl  Strengthening;Right;15 reps green tband    Sit to Sand  15 reps;without UE support      Knee/Hip Exercises: Supine   Bridges  Strengthening;Both;15 reps  Straight Leg Raise with External Rotation  Strengthening;Right;2 sets;10 reps               PT Short Term Goals - 12/24/17 1621      PT SHORT TERM GOAL #1   Title  patient to be independent with initial HEP    Status  Achieved      PT SHORT TERM GOAL #2   Title  patient to demonstrate R knee PROM 0 - 100    Status  Partially Met R knee 4-106 dg         PT Long Term Goals - 12/16/17 1406      PT LONG TERM GOAL #1   Title  patient to be independent with advanced HEP    Status  On-going      PT LONG TERM GOAL #2   Title  patient to improve R knee AROM 0-100    Status  On-going      PT LONG TERM GOAL #3   Title  patient to demonstrate heel toe gait mechanics without evidence of instability with LRAD    Status  On-going      PT LONG TERM GOAL #4   Title  patient to demonstrate R LE SLR without quad lag    Status  On-going      PT LONG TERM GOAL #5   Title  patient to demosntrate reciprocal stair navigation with single hand rail up/down 4 steps    Status  On-going            Plan - 12/29/17 1624    Clinical  Impression Statement  Myrikal continues to make excellent progress with skilled PT intervention. Most difficulty today with R LE eccentric step down requiring heavy VC/TC to complete task - however, much compensation at B UE and hips. Patient making good progress towards goals.     PT Treatment/Interventions  ADLs/Self Care Home Management;Cryotherapy;Electrical Stimulation;Moist Heat;Therapeutic exercise;Therapeutic activities;Functional mobility training;Stair training;Gait training;DME Instruction;Balance training;Neuromuscular re-education;Patient/family education;Manual techniques;Vasopneumatic Device;Passive range of motion    Consulted and Agree with Plan of Care  Patient       Patient will benefit from skilled therapeutic intervention in order to improve the following deficits and impairments:  Abnormal gait, Decreased activity tolerance, Decreased balance, Decreased range of motion, Decreased mobility, Difficulty walking, Pain, Decreased strength  Visit Diagnosis: Acute pain of right knee  Stiffness of right knee, not elsewhere classified  Difficulty in walking, not elsewhere classified  Muscle weakness (generalized)  Other abnormalities of gait and mobility     Problem List Patient Active Problem List   Diagnosis Date Noted  . 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  [place], [place], [address] Phone: [phone]   Fax:  [phone]  Name: Krystal James MRN: [id number] Date of Birth: 1951/03/05

## 2017-12-31 ENCOUNTER — Ambulatory Visit: Payer: 59

## 2017-12-31 DIAGNOSIS — M6281 Muscle weakness (generalized): Secondary | ICD-10-CM

## 2017-12-31 DIAGNOSIS — M25661 Stiffness of right knee, not elsewhere classified: Secondary | ICD-10-CM

## 2017-12-31 DIAGNOSIS — M25561 Pain in right knee: Secondary | ICD-10-CM | POA: Diagnosis not present

## 2017-12-31 DIAGNOSIS — R262 Difficulty in walking, not elsewhere classified: Secondary | ICD-10-CM

## 2017-12-31 DIAGNOSIS — R2689 Other abnormalities of gait and mobility: Secondary | ICD-10-CM

## 2017-12-31 NOTE — Therapy (Signed)
[facility] [address]  [place], [place], [address] Phone: [phone]   Fax:  [phone]  Physical Therapy Treatment  Patient Details  Name: TASNIM BALENTINE MRN: [id number] Date of Birth: 08-Jul-1951 Referring Provider: Dr. Alvan Dame   Encounter Date: 12/31/2017  PT End of Session - 12/31/17 1624    Visit Number  7    Number of Visits  10    Date for PT Re-Evaluation  01/14/18    PT Start Time  1616    PT Stop Time  1656    PT Time Calculation (min)  40 min    Activity Tolerance  Patient tolerated treatment well    Behavior During Therapy  [facility] for tasks assessed/performed       Past Medical History:  Diagnosis Date  . Diverticulosis of colon   . GERD (gastroesophageal reflux disease)   . Hiatal hernia   . Hyperlipidemia   . Hypertension   . Nocturia more than twice per night   . OA (osteoarthritis)    right and left knee,  hands  . OA (osteoarthritis) of knee    right and left,    . PONV (postoperative nausea and vomiting)   . Type 2 diabetes mellitus ([place])    followed by pcp  . Wears glasses     Past Surgical History:  Procedure Laterality Date  . APPENDECTOMY  age 66  . BREAST REDUCTION SURGERY Bilateral 1994  . COLONOSCOPY  2018  . ESOPHAGOGASTRODUODENOSCOPY  2017  . KNEE ARTHROSCOPY Right 2014   dr Alvan Dame  . SHOULDER OPEN ROTATOR CUFF REPAIR Right 10-21-2001  dr Collier [facility]  . TOTAL KNEE ARTHROPLASTY Right 12/07/2017   Procedure: RIGHT TOTAL KNEE ARTHROPLASTY;  Surgeon: Paralee Cancel, MD;  Location: [facility];  Service: Orthopedics;  Laterality: Right;  90 mins  . TUBAL LIGATION Bilateral yrs ago    There were no vitals filed for this visit.  Subjective Assessment - 12/31/17 1622    Subjective  Pt. doing well today.  Still waking with pain.      Patient is accompained by:  Family member husband     Pertinent History  DM, HTN    Patient Stated Goals  improve pain.     Currently in Pain?  Yes     Pain Score  1     Pain Location  Knee    Pain Orientation  Right    Pain Descriptors / Indicators  Aching;Sore    Pain Type  Acute pain;Surgical pain    Pain Onset  1 to 4 weeks ago    Pain Frequency  Intermittent    Aggravating Factors   bending knees     Pain Relieving Factors  ice     Multiple Pain Sites  No                       [facility] Adult PT Treatment/Exercise - 12/31/17 1631      Self-Care   Self-Care  Other Self-Care Comments    Other Self-Care Comments   Instructed pt. on proper side to use SPC on as she has been walking with SPC on R; for improved safety      Neuro Re-ed    Neuro Re-ed Details   Alternating step up/back to airex pad x 10 reps; 2 HH assist       Knee/Hip Exercises: Stretches   Gastroc Stretch  Right;2 reps;30 seconds  Gastroc Stretch Limitations  prostretch       Knee/Hip Exercises: Aerobic   Nustep  Lvl 4, 7 min       Knee/Hip Exercises: Machines for Strengthening   Cybex Leg Press  B LE's 15# x 15 reps      Knee/Hip Exercises: Standing   SLS  R SLS 3 x 10 sec; chair     Other Standing Knee Exercises  Toe-clears to cones x 10 reps; 1 HH assist     Other Standing Knee Exercises  Alternating 1/2 foam bolster step over/back with R LE focusing on TKE x 10 reps; with SPC and 1 HH assist from therapist  Required cueing for full TKE      Knee/Hip Exercises: Supine   Straight Leg Raises  Right;10 reps    Straight Leg Raises Limitations  2# - quad set prior to each movement       Knee/Hip Exercises: Sidelying   Hip ABduction  Right;Strengthening;10 reps    Hip ADduction  Right;15 reps;Strengthening      Manual Therapy   Manual Therapy  Joint mobilization    Joint Mobilization  R patellar mobility all directions - limited mobility; R knee A/P mobs for improved ROM               PT Short Term Goals - 12/24/17 1621      PT SHORT TERM GOAL #1   Title  patient to be independent with initial HEP    Status  Achieved       PT SHORT TERM GOAL #2   Title  patient to demonstrate R knee PROM 0 - 100    Status  Partially Met R knee 4-106 dg         PT Long Term Goals - 12/16/17 1406      PT LONG TERM GOAL #1   Title  patient to be independent with advanced HEP    Status  On-going      PT LONG TERM GOAL #2   Title  patient to improve R knee AROM 0-100    Status  On-going      PT LONG TERM GOAL #3   Title  patient to demonstrate heel toe gait mechanics without evidence of instability with LRAD    Status  On-going      PT LONG TERM GOAL #4   Title  patient to demonstrate R LE SLR without quad lag    Status  On-going      PT LONG TERM GOAL #5   Title  patient to demosntrate reciprocal stair navigation with single hand rail up/down 4 steps    Status  On-going            Plan - 12/31/17 1657    Clinical Impression Statement  Seila reporting R knee primarily feeling stiff today.  Tolerated addition of TKE bolster step-over, sidelying hip abd/add, and leg press machine strengthening well today.  Declined ice to end treatment as she plans to ice at home.      PT Treatment/Interventions  ADLs/Self Care Home Management;Cryotherapy;Electrical Stimulation;Moist Heat;Therapeutic exercise;Therapeutic activities;Functional mobility training;Stair training;Gait training;DME Instruction;Balance training;Neuromuscular re-education;Patient/family education;Manual techniques;Vasopneumatic Device;Passive range of motion    Consulted and Agree with Plan of Care  Patient       Patient will benefit from skilled therapeutic intervention in order to improve the following deficits and impairments:  Abnormal gait, Decreased activity tolerance, Decreased balance, Decreased range of motion, Decreased mobility, Difficulty walking, Pain, Decreased strength  Visit Diagnosis: Acute pain of right knee  Stiffness of right knee, not elsewhere classified  Difficulty in walking, not elsewhere classified  Muscle weakness  (generalized)  Other abnormalities of gait and mobility     Problem List Patient Active Problem List   Diagnosis Date Noted  . 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  [place], [place], [address] Phone: [phone]   Fax:  [phone]  Name: SHAWNISE PETERKIN MRN: [id number] Date of Birth: 1951/09/01

## 2018-01-01 ENCOUNTER — Encounter (HOSPITAL_COMMUNITY): Payer: Self-pay

## 2018-01-04 NOTE — Patient Instructions (Signed)
Your procedure is scheduled on: Tuesday, Jan 12, 2018   Surgery Time:  7:15AM-8:25AM   Report to [facility] Main  Entrance    Report to admitting at 5:30 AM   Call this number if you have problems the morning of surgery [phone]   Do not eat food or drink liquids :After Midnight.   Do NOT smoke after Midnight   Take these medicines the morning of surgery with A SIP OF WATER:  Amlodipine, Nexium, Fenofibrate    DO NOT TAKE ANY DIABETIC MEDICATIONS DAY OF YOUR SURGERY                               You may not have any metal on your body including hair pins, jewelry, and body piercings             Do not wear make-up, lotions, powders, perfumes/cologne, or deodorant             Do not wear nail polish.  Do not shave  48 hours prior to surgery.                Do not bring valuables to the hospital. [facility] IS NOT             RESPONSIBLE   FOR VALUABLES.   Contacts, dentures or bridgework may not be worn into surgery.   Leave suitcase in the car. After surgery it may be brought to your room.   Special Instructions: Bring a copy of your healthcare power of attorney and living will documents         the day of surgery if you haven't scanned them in before.              Please read over the following fact sheets you were given:  [facility] - Preparing for Surgery Before surgery, you can play an important role.  Because skin is not sterile, your skin needs to be as free of germs as possible.  You can reduce the number of germs on your skin by washing with CHG (chlorahexidine gluconate) soap before surgery.  CHG is an antiseptic cleaner which kills germs and bonds with the skin to continue killing germs even after washing. Please DO NOT use if you have an allergy to CHG or antibacterial soaps.  If your skin becomes reddened/irritated stop using the CHG and inform your nurse when you arrive at Short Stay. Do not shave (including legs and underarms) for at least 48  hours prior to the first CHG shower.  You may shave your face/neck.  Please follow these instructions carefully:  1.  Shower with CHG Soap the night before surgery and the  morning of surgery.  2.  If you choose to wash your hair, wash your hair first as usual with your normal  shampoo.  3.  After you shampoo, rinse your hair and body thoroughly to remove the shampoo.                             4.  Use CHG as you would any other liquid soap.  You can apply chg directly to the skin and wash.  Gently with a scrungie or clean washcloth.  5.  Apply the CHG Soap to your body ONLY FROM THE NECK DOWN.   Do   not use on face/ open  Wound or open sores. Avoid contact with eyes, ears mouth and   genitals (private parts).                       Wash face,  Genitals (private parts) with your normal soap.             6.  Wash thoroughly, paying special attention to the area where your    surgery  will be performed.  7.  Thoroughly rinse your body with warm water from the neck down.  8.  DO NOT shower/wash with your normal soap after using and rinsing off the CHG Soap.                9.  Pat yourself dry with a clean towel.            10.  Wear clean pajamas.            11.  Place clean sheets on your bed the night of your first shower and do not  sleep with pets. Day of Surgery : Do not apply any lotions/deodorants the morning of surgery.  Please wear clean clothes to the hospital/surgery center.  FAILURE TO FOLLOW THESE INSTRUCTIONS MAY RESULT IN THE CANCELLATION OF YOUR SURGERY  PATIENT SIGNATURE_________________________________  NURSE SIGNATURE__________________________________  ________________________________________________________________________   [name]  An incentive spirometer is a tool that can help keep your lungs clear and active. This tool measures how well you are filling your lungs with each breath. Taking long deep breaths may help reverse or  decrease the chance of developing breathing (pulmonary) problems (especially infection) following:  A long period of time when you are unable to move or be active. BEFORE THE PROCEDURE   If the spirometer includes an indicator to show your best effort, your nurse or respiratory therapist will set it to a desired goal.  If possible, sit up straight or lean slightly forward. Try not to slouch.  Hold the incentive spirometer in an upright position. INSTRUCTIONS FOR USE  1. Sit on the edge of your bed if possible, or sit up as far as you can in bed or on a chair. 2. Hold the incentive spirometer in an upright position. 3. Breathe out normally. 4. Place the mouthpiece in your mouth and seal your lips tightly around it. 5. Breathe in slowly and as deeply as possible, raising the piston or the ball toward the top of the column. 6. Hold your breath for 3-5 seconds or for as long as possible. Allow the piston or ball to fall to the bottom of the column. 7. Remove the mouthpiece from your mouth and breathe out normally. 8. Rest for a few seconds and repeat Steps 1 through 7 at least 10 times every 1-2 hours when you are awake. Take your time and take a few normal breaths between deep breaths. 9. The spirometer may include an indicator to show your best effort. Use the indicator as a goal to work toward during each repetition. 10. After each set of 10 deep breaths, practice coughing to be sure your lungs are clear. If you have an incision (the cut made at the time of surgery), support your incision when coughing by placing a pillow or rolled up towels firmly against it. Once you are able to get out of bed, walk around indoors and cough well. You may stop using the incentive spirometer when instructed by your caregiver.  RISKS AND COMPLICATIONS  Take your time  so you do not get dizzy or light-headed.  If you are in pain, you may need to take or ask for pain medication before doing incentive spirometry.  It is harder to take a deep breath if you are having pain. AFTER USE  Rest and breathe slowly and easily.  It can be helpful to keep track of a log of your progress. Your caregiver can provide you with a simple table to help with this. If you are using the spirometer at home, follow these instructions: [place] IF:   You are having difficultly using the spirometer.  You have trouble using the spirometer as often as instructed.  Your pain medication is not giving enough relief while using the spirometer.  You develop fever of 100.5 F (38.1 C) or higher. SEEK IMMEDIATE MEDICAL CARE IF:   You cough up bloody sputum that had not been present before.  You develop fever of 102 F (38.9 C) or greater.  You develop worsening pain at or near the incision site. MAKE SURE YOU:   Understand these instructions.  Will watch your condition.  Will get help right away if you are not doing well or get worse. Document Released: 01/05/2007 Document Revised: 11/17/2011 Document Reviewed: 03/08/2007 ExitCare Patient Information 2014 ExitCare, [place].   ________________________________________________________________________  WHAT IS A BLOOD TRANSFUSION? Blood Transfusion Information  A transfusion is the replacement of blood or some of its parts. Blood is made up of multiple cells which provide different functions.  Red blood cells carry oxygen and are used for blood loss replacement.  White blood cells fight against infection.  Platelets control bleeding.  Plasma helps clot blood.  Other blood products are available for specialized needs, such as hemophilia or other clotting disorders. BEFORE THE TRANSFUSION  Who gives blood for transfusions?   Healthy volunteers who are fully evaluated to make sure their blood is safe. This is blood bank blood. Transfusion therapy is the safest it has ever been in the practice of medicine. Before blood is taken from a donor, a complete  history is taken to make sure that person has no history of diseases nor engages in risky social behavior (examples are intravenous drug use or sexual activity with multiple partners). The donor's travel history is screened to minimize risk of transmitting infections, such as malaria. The donated blood is tested for signs of infectious diseases, such as HIV and hepatitis. The blood is then tested to be sure it is compatible with you in order to minimize the chance of a transfusion reaction. If you or a relative donates blood, this is often done in anticipation of surgery and is not appropriate for emergency situations. It takes many days to process the donated blood. RISKS AND COMPLICATIONS Although transfusion therapy is very safe and saves many lives, the main dangers of transfusion include:   Getting an infectious disease.  Developing a transfusion reaction. This is an allergic reaction to something in the blood you were given. Every precaution is taken to prevent this. The decision to have a blood transfusion has been considered carefully by your caregiver before blood is given. Blood is not given unless the benefits outweigh the risks. AFTER THE TRANSFUSION  Right after receiving a blood transfusion, you will usually feel much better and more energetic. This is especially true if your red blood cells have gotten low (anemic). The transfusion raises the level of the red blood cells which carry oxygen, and this usually causes an energy increase.  The  nurse administering the transfusion will monitor you carefully for complications. HOME CARE INSTRUCTIONS  No special instructions are needed after a transfusion. You may find your energy is better. Speak with your caregiver about any limitations on activity for underlying diseases you may have. SEEK MEDICAL CARE IF:   Your condition is not improving after your transfusion.  You develop redness or irritation at the intravenous (IV) site. SEEK  IMMEDIATE MEDICAL CARE IF:  Any of the following symptoms occur over the next 12 hours:  Shaking chills.  You have a temperature by mouth above 102 F (38.9 C), not controlled by medicine.  Chest, back, or muscle pain.  People around you feel you are not acting correctly or are confused.  Shortness of breath or difficulty breathing.  Dizziness and fainting.  You get a rash or develop hives.  You have a decrease in urine output.  Your urine turns a dark color or changes to pink, red, or brown. Any of the following symptoms occur over the next 10 days:  You have a temperature by mouth above 102 F (38.9 C), not controlled by medicine.  Shortness of breath.  Weakness after normal activity.  The white part of the eye turns yellow (jaundice).  You have a decrease in the amount of urine or are urinating less often.  Your urine turns a dark color or changes to pink, red, or brown. Document Released: 08/22/2000 Document Revised: 11/17/2011 Document Reviewed: 04/10/2008 [employer] Patient Information 2014 [place], [place].  _______________________________________________________________________

## 2018-01-04 NOTE — Pre-Procedure Instructions (Signed)
The following are in the hard chart for Ms. Townsend: Surgical Clearance Dr. Alvera Novel EKG 11/26/2017 Hgb A1C (6.1) 11/26/2017

## 2018-01-05 ENCOUNTER — Ambulatory Visit: Payer: 59 | Admitting: Physical Therapy

## 2018-01-05 ENCOUNTER — Encounter: Payer: Self-pay | Admitting: Physical Therapy

## 2018-01-05 DIAGNOSIS — M25661 Stiffness of right knee, not elsewhere classified: Secondary | ICD-10-CM

## 2018-01-05 DIAGNOSIS — R2689 Other abnormalities of gait and mobility: Secondary | ICD-10-CM

## 2018-01-05 DIAGNOSIS — M25561 Pain in right knee: Secondary | ICD-10-CM | POA: Diagnosis not present

## 2018-01-05 DIAGNOSIS — R262 Difficulty in walking, not elsewhere classified: Secondary | ICD-10-CM

## 2018-01-05 DIAGNOSIS — M6281 Muscle weakness (generalized): Secondary | ICD-10-CM

## 2018-01-05 NOTE — Therapy (Signed)
[facility] [address]  [place], [place], [address] Phone: [phone]   Fax:  [phone]  Physical Therapy Treatment  Patient Details  Name: Krystal James MRN: [id number] Date of Birth: 04/11/1951 Referring Provider: Dr. Alvan Dame   Encounter Date: 01/05/2018  PT End of Session - 01/05/18 1623    Visit Number  8    Number of Visits  10    Date for PT Re-Evaluation  01/14/18    PT Start Time  6440    PT Stop Time  1657    PT Time Calculation (min)  42 min    Activity Tolerance  Patient tolerated treatment well    Behavior During Therapy  [facility] for tasks assessed/performed       Past Medical History:  Diagnosis Date  . Diverticulosis of colon   . GERD (gastroesophageal reflux disease)   . Hiatal hernia   . Hyperlipidemia   . Hypertension   . Nocturia more than twice per night   . OA (osteoarthritis)    right and left knee,  hands  . OA (osteoarthritis) of knee    right and left,    . PONV (postoperative nausea and vomiting)   . Type 2 diabetes mellitus ([place])    followed by pcp  . Wears glasses     Past Surgical History:  Procedure Laterality Date  . APPENDECTOMY  age 51  . BREAST REDUCTION SURGERY Bilateral 1994  . COLONOSCOPY  2018  . ESOPHAGOGASTRODUODENOSCOPY  2017  . KNEE ARTHROSCOPY Right 2014   dr Alvan Dame  . SHOULDER OPEN ROTATOR CUFF REPAIR Right 10-21-2001  dr Collier [facility]  . TOTAL KNEE ARTHROPLASTY Right 12/07/2017   Procedure: RIGHT TOTAL KNEE ARTHROPLASTY;  Surgeon: Paralee Cancel, MD;  Location: [facility];  Service: Orthopedics;  Laterality: Right;  90 mins  . TUBAL LIGATION Bilateral yrs ago    There were no vitals filed for this visit.  Subjective Assessment - 01/05/18 1619    Subjective  walking without cane a good bit of the time    Patient is accompained by:  Family member husband    Pertinent History  DM, HTN    Patient Stated Goals  improve pain.     Currently in Pain?  Yes    Pain Score  1     Pain Location  Knee    Pain Orientation  Right    Pain Descriptors / Indicators  Aching;Sore    Pain Type  Acute pain;Surgical pain                       [facility] Adult PT Treatment/Exercise - 01/05/18 0001      Knee/Hip Exercises: Standing   Heel Raises  Both;15 reps    Heel Raises Limitations  negative    Lateral Step Up  Right;Left;10 reps;Hand Hold: 1;Step Height: 6"    Forward Step Up  Right;10 reps;Hand Hold: 1;Step Height: 8"    Functional Squat  15 reps    Functional Squat Limitations  TRX    Walking with Sports Cord  resisted walking - [place] tband - 1 lap around gym    Other Standing Knee Exercises  B side stepping - red tband x 20 feet               PT Short Term Goals - 12/24/17 1621      PT SHORT TERM GOAL #1   Title  patient to  be independent with initial HEP    Status  Achieved      PT SHORT TERM GOAL #2   Title  patient to demonstrate R knee PROM 0 - 100    Status  Partially Met R knee 4-106 dg         PT Long Term Goals - 12/16/17 1406      PT LONG TERM GOAL #1   Title  patient to be independent with advanced HEP    Status  On-going      PT LONG TERM GOAL #2   Title  patient to improve R knee AROM 0-100    Status  On-going      PT LONG TERM GOAL #3   Title  patient to demonstrate heel toe gait mechanics without evidence of instability with LRAD    Status  On-going      PT LONG TERM GOAL #4   Title  patient to demonstrate R LE SLR without quad lag    Status  On-going      PT LONG TERM GOAL #5   Title  patient to demosntrate reciprocal stair navigation with single hand rail up/down 4 steps    Status  On-going            Plan - 01/05/18 1623    Clinical Impression Statement  [name] doing well today - did need continued education regarding appropriate usage of SPC in opposite hand for optiaml gait mechanics. Able to progress well today without issue - other than general fatigue as expected. Next session  to wrap up this POC with patient to present on 01/12/18 for L TKA. Will plan to assess goals at next visit.     PT Treatment/Interventions  ADLs/Self Care Home Management;Cryotherapy;Electrical Stimulation;Moist Heat;Therapeutic exercise;Therapeutic activities;Functional mobility training;Stair training;Gait training;DME Instruction;Balance training;Neuromuscular re-education;Patient/family education;Manual techniques;Vasopneumatic Device;Passive range of motion    Consulted and Agree with Plan of Care  Patient       Patient will benefit from skilled therapeutic intervention in order to improve the following deficits and impairments:  Abnormal gait, Decreased activity tolerance, Decreased balance, Decreased range of motion, Decreased mobility, Difficulty walking, Pain, Decreased strength  Visit Diagnosis: Acute pain of right knee  Stiffness of right knee, not elsewhere classified  Difficulty in walking, not elsewhere classified  Muscle weakness (generalized)  Other abnormalities of gait and mobility     Problem List Patient Active Problem List   Diagnosis Date Noted  . 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  [address] [place], [place], [address] Phone: [phone]   Fax:  [phone]  Name: Krystal James MRN: [id number] Date of Birth: 19-Mar-1951

## 2018-01-06 ENCOUNTER — Encounter (HOSPITAL_COMMUNITY)
Admission: RE | Admit: 2018-01-06 | Discharge: 2018-01-06 | Disposition: A | Discharge status: Home / Self Care | Payer: 59 | Source: Ambulatory Visit | Attending: Orthopedic Surgery | Admitting: Orthopedic Surgery

## 2018-01-06 ENCOUNTER — Other Ambulatory Visit: Payer: Self-pay

## 2018-01-06 ENCOUNTER — Encounter (HOSPITAL_COMMUNITY): Payer: Self-pay

## 2018-01-06 DIAGNOSIS — Z01812 Encounter for preprocedural laboratory examination: Secondary | ICD-10-CM | POA: Diagnosis present

## 2018-01-06 DIAGNOSIS — M1712 Unilateral primary osteoarthritis, left knee: Secondary | ICD-10-CM | POA: Diagnosis not present

## 2018-01-06 DIAGNOSIS — M25562 Pain in left knee: Secondary | ICD-10-CM | POA: Diagnosis not present

## 2018-01-06 LAB — BASIC METABOLIC PANEL
Anion gap: 8 (ref 5–15)
BUN: 21 mg/dL — AB (ref 6–20)
CHLORIDE: 107 mmol/L (ref 101–111)
CO2: 23 mmol/L (ref 22–32)
CREATININE: 0.53 mg/dL (ref 0.44–1.00)
Calcium: 10.2 mg/dL (ref 8.9–10.3)
GFR calc Af Amer: >60 mL/min (ref >60)
GFR calc non Af Amer: >60 mL/min (ref >60)
Glucose, Bld: 119 mg/dL — ABNORMAL HIGH (ref 65–99)
POTASSIUM: 4.6 mmol/L (ref 3.5–5.1)
SODIUM: 138 mmol/L (ref 135–145)

## 2018-01-06 LAB — CBC
HEMATOCRIT: 31.6 % — AB (ref 36.0–46.0)
HEMOGLOBIN: 10.2 g/dL — AB (ref 12.0–15.0)
MCH: 27.1 pg (ref 26.0–34.0)
MCHC: 32.3 g/dL (ref 30.0–36.0)
MCV: 83.8 fL (ref 78.0–100.0)
Platelets: 174 10*3/uL (ref 150–400)
RBC: 3.77 MIL/uL — AB (ref 3.87–5.11)
RDW: 14.3 % (ref 11.5–15.5)
WBC: 4.6 10*3/uL (ref 4.0–10.5)

## 2018-01-06 LAB — SURGICAL PCR SCREEN
MRSA, PCR: NEGATIVE
Staphylococcus aureus: POSITIVE — AB

## 2018-01-06 LAB — GLUCOSE, CAPILLARY: GLUCOSE-CAPILLARY: 102 mg/dL — AB (ref 65–99)

## 2018-01-06 NOTE — Pre-Procedure Instructions (Signed)
CBC and BMP results 01/06/2018 faxed to Dr. Charlann Boxer via epic.

## 2018-01-07 ENCOUNTER — Ambulatory Visit: Payer: 59 | Attending: Orthopedic Surgery | Admitting: Physical Therapy

## 2018-01-07 ENCOUNTER — Encounter: Payer: Self-pay | Admitting: Physical Therapy

## 2018-01-07 DIAGNOSIS — M25562 Pain in left knee: Secondary | ICD-10-CM | POA: Insufficient documentation

## 2018-01-07 DIAGNOSIS — R262 Difficulty in walking, not elsewhere classified: Secondary | ICD-10-CM

## 2018-01-07 DIAGNOSIS — R2689 Other abnormalities of gait and mobility: Secondary | ICD-10-CM | POA: Diagnosis present

## 2018-01-07 DIAGNOSIS — M25561 Pain in right knee: Secondary | ICD-10-CM | POA: Diagnosis not present

## 2018-01-07 DIAGNOSIS — M6281 Muscle weakness (generalized): Secondary | ICD-10-CM

## 2018-01-07 DIAGNOSIS — M25662 Stiffness of left knee, not elsewhere classified: Secondary | ICD-10-CM | POA: Insufficient documentation

## 2018-01-07 DIAGNOSIS — M25661 Stiffness of right knee, not elsewhere classified: Secondary | ICD-10-CM | POA: Insufficient documentation

## 2018-01-07 NOTE — Therapy (Signed)
[facility] [address]  [place], [place], [address] Phone: [phone]   Fax:  [phone]  Physical Therapy Treatment  Patient Details  Name: Krystal James MRN: [id number] Date of Birth: [birth year] Referring Provider: Dr. Alvan Dame   Encounter Date: 01/07/2018  PT End of Session - 01/07/18 1658    Visit Number  9    Number of Visits  10    Date for PT Re-Evaluation  01/14/18    PT Start Time  2409    PT Stop Time  1658    PT Time Calculation (min)  45 min    Activity Tolerance  Patient tolerated treatment well    Behavior During Therapy  [facility] for tasks assessed/performed       Past Medical History:  Diagnosis Date  . Diverticulosis of colon   . GERD (gastroesophageal reflux disease)   . Hiatal hernia   . Hyperlipidemia   . Hypertension   . Nocturia more than twice per night   . OA (osteoarthritis)    right and left knee,  hands  . OA (osteoarthritis) of knee    right and left,    . PONV (postoperative nausea and vomiting)   . Type 2 diabetes mellitus ([place])    followed by pcp  . Wears glasses     Past Surgical History:  Procedure Laterality Date  . APPENDECTOMY  age 63  . BREAST REDUCTION SURGERY Bilateral 1994  . COLONOSCOPY  2018  . ESOPHAGOGASTRODUODENOSCOPY  2017  . KNEE ARTHROSCOPY Right 2014   dr Alvan Dame  . SHOULDER OPEN ROTATOR CUFF REPAIR Right 10-21-2001  dr Collier [facility]  . TOTAL KNEE ARTHROPLASTY Right 12/07/2017   Procedure: RIGHT TOTAL KNEE ARTHROPLASTY;  Surgeon: Paralee Cancel, MD;  Location: [facility];  Service: Orthopedics;  Laterality: Right;  90 mins  . TUBAL LIGATION Bilateral yrs ago    There were no vitals filed for this visit.  Subjective Assessment - 01/07/18 1615    Subjective  Patient reports that she has not been sleeping well but her MD has given her something to help her sleep- has not taken any yet. Pain is okay right now and only acts up at night.    Pertinent History  DM,  HTN    Patient Stated Goals  improve pain.     Currently in Pain?  No/denies         [facility] PT Assessment - 01/07/18 0001      AROM   Right/Left Knee  Right;Left    Right Knee Extension  3    Right Knee Flexion  111    Left Knee Extension  0    Left Knee Flexion  125      PROM   Right/Left Knee  --    Right Knee Extension  --    Right Knee Flexion  --                   [facility] Adult PT Treatment/Exercise - 01/07/18 0001      Knee/Hip Exercises: Aerobic   Nustep  L5 x 6 min      Knee/Hip Exercises: Machines for Strengthening   Cybex Knee Extension  B conc/R ecc 15#; 2x5 reps    Cybex Knee Flexion  B conc/R ecc 10#; 2x5 reps    Cybex Leg Press  B LE's 20# x 15 reps      Knee/Hip Exercises: Standing   Terminal Knee Extension  Strengthening;Right;Limitations;15 reps    Terminal Knee Extension Limitations  Pressing knee into green ball; 5 sec hold    Wall Squat  15 reps;Limitations    Wall Squat Limitations  Intermittent standing rest break required d/t pain/fatigue      Knee/Hip Exercises: Seated   Other Seated Knee/Hip Exercises  R LE - fitter - 1 blue/1 Qu x 15      Manual Therapy   Manual Therapy  Soft tissue mobilization Scar tissue mobilization    Manual therapy comments  Gentle circumferential movements on intact surgical scar tissue of R knee               PT Short Term Goals - 12/24/17 1621      PT SHORT TERM GOAL #1   Title  patient to be independent with initial HEP    Status  Achieved      PT SHORT TERM GOAL #2   Title  patient to demonstrate R knee PROM 0 - 100    Status  Partially Met R knee 4-106 dg         PT Long Term Goals - 01/07/18 1631      PT LONG TERM GOAL #1   Title  patient to be independent with advanced HEP    Status  Achieved      PT LONG TERM GOAL #2   Title  patient to improve R knee AROM 0-100    Status  Partially Met      PT LONG TERM GOAL #3   Title  patient to demonstrate heel toe gait mechanics  without evidence of instability with LRAD    Status  Partially Met still lacking end range quad strength needed for overall stability      PT LONG TERM GOAL #4   Title  patient to demonstrate R LE SLR without quad lag    Status  Partially Met      PT LONG TERM GOAL #5   Title  patient to demosntrate reciprocal stair navigation with single hand rail up/down 4 steps    Status  Not Met step to pattern with heavy use of hand rail            Plan - 01/07/18 1722    Clinical Impression Statement  Patient arrived to clinic using [facility] on appropriate side. Patient reports no c/o pain today; reports symptoms are typically at night. Has partially met R knee AROM goals; continued work on alternate stair climbing required due to fatigue and weakness. Patient tolerated LE resistance exercises with good effort; standing rest break required to complete wall squats.  Will see patient next post L LE TKA. Has made apporpraite progress for R TKA thus far, will plan to include continued therapy on R knee following L TKA on 01/12/18.     PT Treatment/Interventions  ADLs/Self Care Home Management;Cryotherapy;Electrical Stimulation;Moist Heat;Therapeutic exercise;Therapeutic activities;Functional mobility training;Stair training;Gait training;DME Instruction;Balance training;Neuromuscular re-education;Patient/family education;Manual techniques;Vasopneumatic Device;Passive range of motion    Consulted and Agree with Plan of Care  Patient       Patient will benefit from skilled therapeutic intervention in order to improve the following deficits and impairments:  Abnormal gait, Decreased activity tolerance, Decreased balance, Decreased range of motion, Decreased mobility, Difficulty walking, Pain, Decreased strength  Visit Diagnosis: Acute pain of right knee  Stiffness of right knee, not elsewhere classified  Difficulty in walking, not elsewhere classified  Muscle weakness (generalized)  Other abnormalities of  gait and mobility  Problem List Patient Active Problem List   Diagnosis Date Noted  . 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  [address] [place] [place], [place], [address] Phone: [phone]   Fax:  [phone]  Name: Krystal James MRN: [id number] Date of Birth: 1951-05-20

## 2018-01-07 NOTE — Pre-Procedure Instructions (Signed)
PCR results 01/06/2018 faxed to Dr. Olin via epic. 

## 2018-01-11 NOTE — Anesthesia Preprocedure Evaluation (Addendum)
Anesthesia Evaluation  Patient identified by MRN, date of birth, ID band Patient awake    Reviewed: Allergy & Precautions, NPO status , Patient's Chart, lab work & pertinent test results  History of Anesthesia Complications (+) PONV  Airway Mallampati: II  TM Distance: >3 FB Neck ROM: Full    Dental no notable dental hx. (+) Dental Advisory Given   Pulmonary neg pulmonary ROS,    Pulmonary exam normal breath sounds clear to auscultation       Cardiovascular hypertension, Pt. on medications Normal cardiovascular exam Rhythm:Regular Rate:Normal     Neuro/Psych negative neurological ROS  negative psych ROS   GI/Hepatic Neg liver ROS, GERD  Medicated,  Endo/Other  diabetes  Renal/GU negative Renal ROS  negative genitourinary   Musculoskeletal negative musculoskeletal ROS (+)   Abdominal   Peds negative pediatric ROS (+)  Hematology negative hematology ROS (+)   Anesthesia Other Findings   Reproductive/Obstetrics negative OB ROS                            Anesthesia Physical Anesthesia Plan  ASA: II  Anesthesia Plan: Spinal   Post-op Pain Management:  Regional for Post-op pain   Induction: Intravenous  PONV Risk Score and Plan: 3 and Ondansetron, Dexamethasone, Midazolam and Treatment may vary due to age or medical condition  Airway Management Planned: Simple Face Mask  Additional Equipment:   Intra-op Plan:   Post-operative Plan:   Informed Consent: I have reviewed the patients History and Physical, chart, labs and discussed the procedure including the risks, benefits and alternatives for the proposed anesthesia with the patient or authorized representative who has indicated his/her understanding and acceptance.   Dental advisory given  Plan Discussed with: CRNA and Surgeon  Anesthesia Plan Comments:         Anesthesia Quick Evaluation

## 2018-01-11 NOTE — H&P (Signed)
TOTAL KNEE ADMISSION H&P  Patient is being admitted for left total knee arthroplasty.  Subjective:  Chief Complaint:  Left knee primary OA / pain  HPI: Krystal James, 67 y.o. female, has a history of pain and functional disability in the left knee due to arthritis and has failed non-surgical conservative treatments for greater than 12 weeks to include NSAID's and/or analgesics, corticosteriod injections, viscosupplementation injections and activity modification.  Onset of symptoms was gradual, starting ~1 year ago with gradually worsening course since that time. The patient noted prior procedures on the knee to include  arthroplasty on the right knee(s).  Patient currently rates pain in the left knee(s) at 9 out of 10 with activity. Patient has night pain, worsening of pain with activity and weight bearing, pain that interferes with activities of daily living, pain with passive range of motion, crepitus and joint swelling.  Patient has evidence of periarticular osteophytes and joint space narrowing by imaging studies. There is no active infection.   Risks, benefits and expectations were discussed with the patient.  Risks including but not limited to the risk of anesthesia, blood clots, nerve damage, blood vessel damage, failure of the prosthesis, infection and up to and including death.  Patient understand the risks, benefits and expectations and wishes to proceed with surgery.   PCP: Angelica Chessman, MD  D/C Plans:       Home   Post-op Meds:       No Rx given  Tranexamic Acid:      To be given - IV   Decadron:      Is to be given2  FYI:      ASA             Norco  DME:     Pt already has equipment  PT:        OPPT Rx given    Patient Active Problem List   Diagnosis Date Noted  . 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 Diverticulosis of colon   . GERD (gastroesophageal reflux disease)   . Hiatal hernia    . Hyperlipidemia   . Hypertension   . Nocturia more than twice per night   . OA (osteoarthritis)    right and left knee,  hands  . OA (osteoarthritis) of knee    right and left,    . PONV (postoperative nausea and vomiting)   . Type 2 diabetes mellitus (HCC)    followed by pcp  . Wears glasses     Past Surgical History:  Procedure Laterality Date  . APPENDECTOMY  age 5  . BREAST REDUCTION SURGERY Bilateral 1994  . COLONOSCOPY  2018  . ESOPHAGOGASTRODUODENOSCOPY  2017  . KNEE ARTHROSCOPY Right 2014   dr Charlann Boxer  . SHOULDER OPEN ROTATOR CUFF REPAIR Right 10-21-2001  dr Leslee Home [facility]  . 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 TUBAL LIGATION Bilateral yrs ago    No current facility-administered medications for this encounter.    Current Outpatient Medications  Medication Sig Dispense Refill Last Dose  . amLODipine (NORVASC) 10 MG tablet Take 10 mg by mouth daily. In the morning.   Taking  . esomeprazole (NEXIUM) 20 MG capsule Take 20 mg by mouth daily before breakfast.   Taking  .  fenofibrate 160 MG tablet Take 160 mg by mouth daily.   Taking  . HYDROcodone-acetaminophen (NORCO) 7.5-325 MG tablet Take 1-2 tablets by mouth every 4 (four) hours as needed for moderate pain. 60 tablet 0 Taking  . lisinopril (PRINIVIL,ZESTRIL) 5 MG tablet Take 5 mg by mouth daily.   Taking  . metFORMIN (GLUCOPHAGE) 500 MG tablet Take 500 mg by mouth 3 (three) times daily with meals.   Taking  . methocarbamol (ROBAXIN) 500 MG tablet Take 1 tablet (500 mg total) by mouth every 6 (six) hours as needed for muscle spasms. 40 tablet 0 Taking  . rosuvastatin (CRESTOR) 10 MG tablet Take 10 mg by mouth daily with lunch.   Taking  . docusate sodium (COLACE) 100 MG capsule Take 1 capsule (100 mg total) by mouth 2 (two) times daily. (Patient not taking: Reported on  12/30/2017) 10 capsule 0 Not Taking at Unknown time  . ferrous sulfate (FERROUSUL) 325 (65 FE) MG tablet Take 1 tablet (325 mg total) by mouth 3 (three) times daily with meals. (Patient not taking: Reported on 12/30/2017)  3 Not Taking at Unknown time   Allergies  Allergen Reactions  . Sulfa Antibiotics Hives    Social History   Tobacco Use  . Smoking status: Never Smoker  . Smokeless tobacco: Never Used  Substance Use Topics  . Alcohol use: No      Review of Systems  Constitutional: Negative.   HENT: Negative.   Eyes: Negative.   Respiratory: Positive for shortness of breath (with exertion).   Cardiovascular: Negative.   Gastrointestinal: Positive for heartburn.  Genitourinary: Positive for frequency.  Musculoskeletal: Positive for joint pain.  Skin: Negative.   Neurological: Negative.   Endo/Heme/Allergies: Negative.   Psychiatric/Behavioral: Negative.     Objective:  Physical Exam  Constitutional: She is oriented to person, place, and time. She appears well-developed.  HENT:  Head: Normocephalic.  Eyes: Pupils are equal, round, and reactive to light.  Neck: Neck supple. No JVD present. No tracheal deviation present. No thyromegaly present.  Cardiovascular: Normal rate, regular rhythm and intact distal pulses.  Respiratory: Effort normal and breath sounds normal. No respiratory distress. She has no wheezes.  GI: Soft. There is no tenderness. There is no guarding.  Musculoskeletal:       Left knee: She exhibits decreased range of motion, swelling and bony tenderness. She exhibits no ecchymosis, no deformity, no laceration and no erythema. Tenderness found.  Lymphadenopathy:    She has no cervical adenopathy.  Neurological: She is alert and oriented to person, place, and time.  Skin: Skin is warm and dry.  Psychiatric: She has a normal mood and affect.       Labs:  Estimated body mass index is 32.77 kg/m as calculated from the following:   Height as of 01/06/18:   (1.6 m).   Weight as of 01/06/18: 83.9 kg (185 lb).   Imaging Review Plain radiographs demonstrate severe degenerative joint disease of the left knee(s). The bone quality appears to be good for age and reported activity level.   Preoperative templating of the joint replacement has been completed, documented, and submitted to the Operating Room personnel in order to optimize intra-operative equipment management.    Patient's anticipated LOS is less than 2 midnights, meeting these requirements: - Lives within 1 hour of care - Has a competent adult at home to recover with post-op recover - NO history of  - Chronic pain requiring opiods  - Diabetes  - Coronary  Artery Disease  - Heart failure  - Heart attack  - Stroke  - DVT/VTE  - Cardiac arrhythmia  - Respiratory Failure/COPD  - Renal failure  - Anemia  - Advanced Liver disease        Assessment/Plan:  End stage arthritis, left knee   The patient history, physical examination, clinical judgment of the provider and imaging studies are consistent with end stage degenerative joint disease of the left knee(s) and total knee arthroplasty is deemed medically necessary. The treatment options including medical management, injection therapy arthroscopy and arthroplasty were discussed at length. The risks and benefits of total knee arthroplasty were presented and reviewed. The risks due to aseptic loosening, infection, stiffness, patella tracking problems, thromboembolic complications and other imponderables were discussed. The patient acknowledged the explanation, agreed to proceed with the plan and consent was signed. Patient is being admitted for inpatient treatment for surgery, pain control, PT, OT, prophylactic antibiotics, VTE prophylaxis, progressive ambulation and ADL's and discharge planning. The patient is planning to be discharged home.     Anastasio Auerbach Othon Guardia   PA-C  01/11/2018, 9:18 PM

## 2018-01-12 ENCOUNTER — Encounter (HOSPITAL_COMMUNITY)
Admission: RE | Disposition: A | Discharge status: Home / Self Care | Payer: Self-pay | Source: Ambulatory Visit | Attending: Orthopedic Surgery

## 2018-01-12 ENCOUNTER — Other Ambulatory Visit: Payer: Self-pay

## 2018-01-12 ENCOUNTER — Encounter (HOSPITAL_COMMUNITY): Payer: Self-pay

## 2018-01-12 ENCOUNTER — Inpatient Hospital Stay (HOSPITAL_COMMUNITY): Payer: 59 | Admitting: Anesthesiology

## 2018-01-12 ENCOUNTER — Observation Stay (HOSPITAL_COMMUNITY)
Admission: RE | Admit: 2018-01-12 | Discharge: 2018-01-13 | Disposition: A | Discharge status: Home / Self Care | Payer: 59 | Source: Ambulatory Visit | Attending: Orthopedic Surgery | Admitting: Orthopedic Surgery

## 2018-01-12 DIAGNOSIS — I10 Essential (primary) hypertension: Secondary | ICD-10-CM | POA: Insufficient documentation

## 2018-01-12 DIAGNOSIS — E669 Obesity, unspecified: Secondary | ICD-10-CM | POA: Diagnosis present

## 2018-01-12 DIAGNOSIS — E785 Hyperlipidemia, unspecified: Secondary | ICD-10-CM | POA: Diagnosis not present

## 2018-01-12 DIAGNOSIS — Z79899 Other long term (current) drug therapy: Secondary | ICD-10-CM | POA: Diagnosis not present

## 2018-01-12 DIAGNOSIS — Z96651 Presence of right artificial knee joint: Secondary | ICD-10-CM | POA: Insufficient documentation

## 2018-01-12 DIAGNOSIS — Z7984 Long term (current) use of oral hypoglycemic drugs: Secondary | ICD-10-CM | POA: Insufficient documentation

## 2018-01-12 DIAGNOSIS — M25762 Osteophyte, left knee: Secondary | ICD-10-CM | POA: Diagnosis not present

## 2018-01-12 DIAGNOSIS — K219 Gastro-esophageal reflux disease without esophagitis: Secondary | ICD-10-CM | POA: Insufficient documentation

## 2018-01-12 DIAGNOSIS — E119 Type 2 diabetes mellitus without complications: Secondary | ICD-10-CM | POA: Insufficient documentation

## 2018-01-12 DIAGNOSIS — Z96659 Presence of unspecified artificial knee joint: Secondary | ICD-10-CM

## 2018-01-12 DIAGNOSIS — M659 Synovitis and tenosynovitis, unspecified: Secondary | ICD-10-CM | POA: Diagnosis not present

## 2018-01-12 DIAGNOSIS — M1712 Unilateral primary osteoarthritis, left knee: Secondary | ICD-10-CM | POA: Diagnosis not present

## 2018-01-12 HISTORY — PX: TOTAL KNEE ARTHROPLASTY: SHX125

## 2018-01-12 LAB — GLUCOSE, CAPILLARY
GLUCOSE-CAPILLARY: 153 mg/dL — AB (ref 65–99)
Glucose-Capillary: 112 mg/dL — ABNORMAL HIGH (ref 65–99)
Glucose-Capillary: 134 mg/dL — ABNORMAL HIGH (ref 65–99)
Glucose-Capillary: 223 mg/dL — ABNORMAL HIGH (ref 65–99)
Glucose-Capillary: 155 mg/dL — ABNORMAL HIGH (ref 65–99)

## 2018-01-12 LAB — TYPE AND SCREEN
ABO/RH(D): O POS
ANTIBODY SCREEN: NEGATIVE

## 2018-01-12 SURGERY — ARTHROPLASTY, KNEE, TOTAL
Anesthesia: Spinal | Site: Knee | Laterality: Left

## 2018-01-12 MED ORDER — HYDROMORPHONE HCL 1 MG/ML IJ SOLN: 0.2500 mg | INTRAMUSCULAR | Status: DC | PRN

## 2018-01-12 MED ORDER — METHOCARBAMOL 500 MG PO TABS
500.0000 mg | ORAL_TABLET | Freq: Four times a day (QID) | ORAL | Status: DC | PRN
  Administered 2018-01-12 – 2018-01-13 (×3): 500 mg via ORAL
  Filled 2018-01-12 (×3): qty 1

## 2018-01-12 MED ORDER — ESOMEPRAZOLE MAGNESIUM 20 MG PO CPDR
20.0000 mg | DELAYED_RELEASE_CAPSULE | Freq: Every day | ORAL | Status: DC
  Administered 2018-01-13: 20 mg via ORAL
  Filled 2018-01-12: qty 1

## 2018-01-12 MED ORDER — OXYCODONE HCL 5 MG PO TABS
5.0000 mg | ORAL_TABLET | ORAL | Status: DC | PRN
  Administered 2018-01-13: 5 mg via ORAL
  Filled 2018-01-12: qty 1

## 2018-01-12 MED ORDER — MIDAZOLAM HCL 2 MG/2ML IJ SOLN
INTRAMUSCULAR | Status: AC
  Filled 2018-01-12: qty 2

## 2018-01-12 MED ORDER — PROPOFOL 10 MG/ML IV BOLUS
INTRAVENOUS | Status: DC | PRN
  Administered 2018-01-12 (×3): 20 mg via INTRAVENOUS

## 2018-01-12 MED ORDER — OXYCODONE HCL 5 MG PO TABS: 5.0000 mg | ORAL_TABLET | ORAL | 0 refills | Status: DC | PRN

## 2018-01-12 MED ORDER — POLYETHYLENE GLYCOL 3350 17 G PO PACK
17.0000 g | PACK | Freq: Two times a day (BID) | ORAL | Status: DC
  Administered 2018-01-12: 17 g via ORAL
  Filled 2018-01-12: qty 1

## 2018-01-12 MED ORDER — DEXAMETHASONE SODIUM PHOSPHATE 10 MG/ML IJ SOLN
10.0000 mg | Freq: Once | INTRAMUSCULAR | Status: AC
  Administered 2018-01-12: 10 mg via INTRAVENOUS

## 2018-01-12 MED ORDER — METOCLOPRAMIDE HCL 5 MG PO TABS: 5.0000 mg | ORAL_TABLET | Freq: Three times a day (TID) | ORAL | Status: DC | PRN

## 2018-01-12 MED ORDER — HYDROMORPHONE HCL 1 MG/ML IJ SOLN
0.5000 mg | INTRAMUSCULAR | Status: DC | PRN
  Administered 2018-01-12 (×3): 1 mg via INTRAVENOUS
  Filled 2018-01-12 (×3): qty 1

## 2018-01-12 MED ORDER — DEXAMETHASONE SODIUM PHOSPHATE 10 MG/ML IJ SOLN
10.0000 mg | Freq: Once | INTRAMUSCULAR | Status: AC
  Administered 2018-01-13: 10 mg via INTRAVENOUS
  Filled 2018-01-12: qty 1

## 2018-01-12 MED ORDER — SODIUM CHLORIDE 0.9 % IJ SOLN
INTRAMUSCULAR | Status: AC
  Filled 2018-01-12: qty 50

## 2018-01-12 MED ORDER — BISACODYL 10 MG RE SUPP: 10.0000 mg | Freq: Every day | RECTAL | Status: DC | PRN

## 2018-01-12 MED ORDER — TRANEXAMIC ACID 1000 MG/10ML IV SOLN
1000.0000 mg | INTRAVENOUS | Status: AC
  Administered 2018-01-12: 1000 mg via INTRAVENOUS
  Filled 2018-01-12: qty 1100

## 2018-01-12 MED ORDER — ACETAMINOPHEN 500 MG PO TABS
1000.0000 mg | ORAL_TABLET | Freq: Four times a day (QID) | ORAL | Status: AC
  Administered 2018-01-12 – 2018-01-13 (×4): 1000 mg via ORAL
  Filled 2018-01-12 (×4): qty 2

## 2018-01-12 MED ORDER — STERILE WATER FOR IRRIGATION IR SOLN
Status: DC | PRN
  Administered 2018-01-12: 2000 mL

## 2018-01-12 MED ORDER — BUPIVACAINE HCL (PF) 0.25 % IJ SOLN
INTRAMUSCULAR | Status: AC
  Filled 2018-01-12: qty 30

## 2018-01-12 MED ORDER — SODIUM CHLORIDE 0.9 % IR SOLN
Status: DC | PRN
  Administered 2018-01-12: 1000 mL

## 2018-01-12 MED ORDER — METHOCARBAMOL 1000 MG/10ML IJ SOLN
500.0000 mg | Freq: Four times a day (QID) | INTRAVENOUS | Status: DC | PRN
  Administered 2018-01-12: 500 mg via INTRAVENOUS
  Filled 2018-01-12: qty 550

## 2018-01-12 MED ORDER — ONDANSETRON HCL 4 MG PO TABS: 4.0000 mg | ORAL_TABLET | Freq: Four times a day (QID) | ORAL | Status: DC | PRN

## 2018-01-12 MED ORDER — PROMETHAZINE HCL 25 MG/ML IJ SOLN: 6.2500 mg | INTRAMUSCULAR | Status: DC | PRN

## 2018-01-12 MED ORDER — PHENOL 1.4 % MT LIQD: 1.0000 | OROMUCOSAL | Status: DC | PRN

## 2018-01-12 MED ORDER — METFORMIN HCL 500 MG PO TABS
500.0000 mg | ORAL_TABLET | Freq: Three times a day (TID) | ORAL | Status: DC
  Administered 2018-01-12 – 2018-01-13 (×3): 500 mg via ORAL
  Filled 2018-01-12 (×2): qty 1

## 2018-01-12 MED ORDER — FERROUS SULFATE 325 (65 FE) MG PO TABS: 325.0000 mg | ORAL_TABLET | Freq: Three times a day (TID) | ORAL | 3 refills | Status: DC

## 2018-01-12 MED ORDER — TRANEXAMIC ACID 1000 MG/10ML IV SOLN
1000.0000 mg | Freq: Once | INTRAVENOUS | Status: AC
  Administered 2018-01-12: 1000 mg via INTRAVENOUS
  Filled 2018-01-12: qty 1100

## 2018-01-12 MED ORDER — ASPIRIN 81 MG PO CHEW
81.0000 mg | CHEWABLE_TABLET | Freq: Two times a day (BID) | ORAL | Status: DC
  Administered 2018-01-12 – 2018-01-13 (×2): 81 mg via ORAL
  Filled 2018-01-12 (×2): qty 1

## 2018-01-12 MED ORDER — METHOCARBAMOL 500 MG PO TABS: 500.0000 mg | ORAL_TABLET | Freq: Four times a day (QID) | ORAL | 0 refills | Status: DC | PRN

## 2018-01-12 MED ORDER — KETOROLAC TROMETHAMINE 30 MG/ML IJ SOLN
INTRAMUSCULAR | Status: DC | PRN
  Administered 2018-01-12: 30 mg via INTRAVENOUS

## 2018-01-12 MED ORDER — FERROUS SULFATE 325 (65 FE) MG PO TABS
325.0000 mg | ORAL_TABLET | Freq: Three times a day (TID) | ORAL | Status: DC
  Administered 2018-01-12 – 2018-01-13 (×3): 325 mg via ORAL
  Filled 2018-01-12 (×3): qty 1

## 2018-01-12 MED ORDER — SODIUM CHLORIDE 0.9 % IJ SOLN
INTRAMUSCULAR | Status: DC | PRN
  Administered 2018-01-12: 30 mL

## 2018-01-12 MED ORDER — PROPOFOL 500 MG/50ML IV EMUL
INTRAVENOUS | Status: DC | PRN
  Administered 2018-01-12: 75 ug/kg/min via INTRAVENOUS

## 2018-01-12 MED ORDER — TOBRAMYCIN SULFATE 1.2 G IJ SOLR
INTRAMUSCULAR | Status: AC
  Filled 2018-01-12: qty 2.4

## 2018-01-12 MED ORDER — TOBRAMYCIN SULFATE 1.2 G IJ SOLR
INTRAMUSCULAR | Status: DC | PRN
  Administered 2018-01-12: 2.4 mg

## 2018-01-12 MED ORDER — PROPOFOL 10 MG/ML IV BOLUS
INTRAVENOUS | Status: AC
  Filled 2018-01-12: qty 20

## 2018-01-12 MED ORDER — OXYCODONE HCL 5 MG PO TABS
10.0000 mg | ORAL_TABLET | ORAL | Status: DC | PRN
  Administered 2018-01-12 – 2018-01-13 (×4): 10 mg via ORAL
  Filled 2018-01-12 (×4): qty 2

## 2018-01-12 MED ORDER — SODIUM CHLORIDE 0.9 % IV SOLN
INTRAVENOUS | Status: DC
  Administered 2018-01-12: 12:00:00 via INTRAVENOUS

## 2018-01-12 MED ORDER — CEFAZOLIN SODIUM-DEXTROSE 2-4 GM/100ML-% IV SOLN
2.0000 g | Freq: Four times a day (QID) | INTRAVENOUS | Status: AC
  Administered 2018-01-12 (×2): 2 g via INTRAVENOUS
  Filled 2018-01-12 (×2): qty 100

## 2018-01-12 MED ORDER — ROPIVACAINE HCL 7.5 MG/ML IJ SOLN
INTRAMUSCULAR | Status: DC | PRN
  Administered 2018-01-12: 20 mL via PERINEURAL

## 2018-01-12 MED ORDER — LACTATED RINGERS IV SOLN
INTRAVENOUS | Status: DC | PRN
  Administered 2018-01-12 (×2): via INTRAVENOUS

## 2018-01-12 MED ORDER — CEFAZOLIN SODIUM-DEXTROSE 2-4 GM/100ML-% IV SOLN
2.0000 g | INTRAVENOUS | Status: AC
  Administered 2018-01-12: 2 g via INTRAVENOUS
  Filled 2018-01-12: qty 100

## 2018-01-12 MED ORDER — EPHEDRINE SULFATE-NACL 50-0.9 MG/10ML-% IV SOSY
PREFILLED_SYRINGE | INTRAVENOUS | Status: DC | PRN
  Administered 2018-01-12 (×4): 5 mg via INTRAVENOUS

## 2018-01-12 MED ORDER — FENTANYL CITRATE (PF) 250 MCG/5ML IJ SOLN
INTRAMUSCULAR | Status: DC | PRN
  Administered 2018-01-12: 25 ug via INTRAVENOUS
  Administered 2018-01-12: 50 ug via INTRAVENOUS
  Administered 2018-01-12: 25 ug via INTRAVENOUS
  Administered 2018-01-12: 50 ug via INTRAVENOUS
  Administered 2018-01-12 (×4): 25 ug via INTRAVENOUS

## 2018-01-12 MED ORDER — INSULIN ASPART 100 UNIT/ML ~~LOC~~ SOLN
0.0000 [IU] | Freq: Three times a day (TID) | SUBCUTANEOUS | Status: DC
  Administered 2018-01-12: 5 [IU] via SUBCUTANEOUS
  Administered 2018-01-12: 3 [IU] via SUBCUTANEOUS

## 2018-01-12 MED ORDER — FENTANYL CITRATE (PF) 250 MCG/5ML IJ SOLN
INTRAMUSCULAR | Status: AC
  Filled 2018-01-12: qty 5

## 2018-01-12 MED ORDER — ACETAMINOPHEN 500 MG PO TABS: 1000.0000 mg | ORAL_TABLET | Freq: Three times a day (TID) | ORAL | 0 refills | Status: DC

## 2018-01-12 MED ORDER — POLYETHYLENE GLYCOL 3350 17 G PO PACK: 17.0000 g | PACK | Freq: Two times a day (BID) | ORAL | 0 refills | Status: DC

## 2018-01-12 MED ORDER — MIDAZOLAM HCL 2 MG/2ML IJ SOLN
INTRAMUSCULAR | Status: DC | PRN
  Administered 2018-01-12 (×2): 1 mg via INTRAVENOUS

## 2018-01-12 MED ORDER — BUPIVACAINE IN DEXTROSE 0.75-8.25 % IT SOLN
INTRATHECAL | Status: DC | PRN
  Administered 2018-01-12: 1.6 mL via INTRATHECAL

## 2018-01-12 MED ORDER — DIPHENHYDRAMINE HCL 12.5 MG/5ML PO ELIX: 12.5000 mg | ORAL_SOLUTION | ORAL | Status: DC | PRN

## 2018-01-12 MED ORDER — BUPIVACAINE HCL (PF) 0.25 % IJ SOLN
INTRAMUSCULAR | Status: DC | PRN
  Administered 2018-01-12: 30 mL

## 2018-01-12 MED ORDER — DOCUSATE SODIUM 100 MG PO CAPS
100.0000 mg | ORAL_CAPSULE | Freq: Two times a day (BID) | ORAL | Status: DC
  Administered 2018-01-12 – 2018-01-13 (×2): 100 mg via ORAL
  Filled 2018-01-12 (×2): qty 1

## 2018-01-12 MED ORDER — ASPIRIN 81 MG PO CHEW: 81.0000 mg | CHEWABLE_TABLET | Freq: Two times a day (BID) | ORAL | 0 refills | Status: DC

## 2018-01-12 MED ORDER — MAGNESIUM CITRATE PO SOLN: 1.0000 | Freq: Once | ORAL | Status: DC | PRN

## 2018-01-12 MED ORDER — ONDANSETRON HCL 4 MG/2ML IJ SOLN
INTRAMUSCULAR | Status: DC | PRN
  Administered 2018-01-12: 4 mg via INTRAVENOUS

## 2018-01-12 MED ORDER — DOCUSATE SODIUM 100 MG PO CAPS: 100.0000 mg | ORAL_CAPSULE | Freq: Two times a day (BID) | ORAL | 0 refills | Status: DC

## 2018-01-12 MED ORDER — KETOROLAC TROMETHAMINE 30 MG/ML IJ SOLN
INTRAMUSCULAR | Status: AC
  Filled 2018-01-12: qty 1

## 2018-01-12 MED ORDER — MENTHOL 3 MG MT LOZG: 1.0000 | LOZENGE | OROMUCOSAL | Status: DC | PRN

## 2018-01-12 MED ORDER — ROSUVASTATIN CALCIUM 10 MG PO TABS
10.0000 mg | ORAL_TABLET | Freq: Every day | ORAL | Status: DC
  Administered 2018-01-12: 10 mg via ORAL
  Filled 2018-01-12: qty 1

## 2018-01-12 MED ORDER — CHLORHEXIDINE GLUCONATE 4 % EX LIQD: 60.0000 mL | Freq: Once | CUTANEOUS | Status: DC

## 2018-01-12 MED ORDER — ONDANSETRON HCL 4 MG/2ML IJ SOLN: 4.0000 mg | Freq: Four times a day (QID) | INTRAMUSCULAR | Status: DC | PRN

## 2018-01-12 MED ORDER — ALUM & MAG HYDROXIDE-SIMETH 200-200-20 MG/5ML PO SUSP: 15.0000 mL | ORAL | Status: DC | PRN

## 2018-01-12 MED ORDER — LIDOCAINE 2% (20 MG/ML) 5 ML SYRINGE
INTRAMUSCULAR | Status: DC | PRN
  Administered 2018-01-12 (×2): 40 mg via INTRAVENOUS

## 2018-01-12 MED ORDER — METOCLOPRAMIDE HCL 5 MG/ML IJ SOLN: 5.0000 mg | Freq: Three times a day (TID) | INTRAMUSCULAR | Status: DC | PRN

## 2018-01-12 MED ORDER — AMLODIPINE BESYLATE 10 MG PO TABS
10.0000 mg | ORAL_TABLET | Freq: Every day | ORAL | Status: DC
  Administered 2018-01-13: 10 mg via ORAL
  Filled 2018-01-12: qty 1

## 2018-01-12 SURGICAL SUPPLY — 48 items
ADH SKN CLS APL DERMABOND .7 (GAUZE/BANDAGES/DRESSINGS) ×1
BAG DECANTER FOR FLEXI CONT (MISCELLANEOUS) IMPLANT
BAG SPEC THK2 15X12 ZIP CLS (MISCELLANEOUS)
BAG ZIPLOCK 12X15 (MISCELLANEOUS) IMPLANT
BANDAGE ACE 6X5 VEL STRL LF (GAUZE/BANDAGES/DRESSINGS) ×3 IMPLANT
BLADE SAW SGTL 11.0X1.19X90.0M (BLADE) IMPLANT
BLADE SAW SGTL 13.0X1.19X90.0M (BLADE) ×3 IMPLANT
BOWL SMART MIX CTS (DISPOSABLE) ×3 IMPLANT
CAPT KNEE TOTAL 3 ATTUNE ×3 IMPLANT
CEMENT HV SMART SET (Cement) ×6 IMPLANT
COVER SURGICAL LIGHT HANDLE (MISCELLANEOUS) ×3 IMPLANT
CUFF TOURN SGL QUICK 34 (TOURNIQUET CUFF) ×3
CUFF TRNQT CYL 34X4X40X1 (TOURNIQUET CUFF) ×1 IMPLANT
DECANTER SPIKE VIAL GLASS SM (MISCELLANEOUS) ×3 IMPLANT
DERMABOND ADVANCED (GAUZE/BANDAGES/DRESSINGS) ×2
DERMABOND ADVANCED .7 DNX12 (GAUZE/BANDAGES/DRESSINGS) ×1 IMPLANT
DRAPE TOP 10253 STERILE (DRAPES) IMPLANT
DRAPE U-SHAPE 47X51 STRL (DRAPES) ×3 IMPLANT
DRESSING AQUACEL AG SP 3.5X10 (GAUZE/BANDAGES/DRESSINGS) ×1 IMPLANT
DRSG AQUACEL AG SP 3.5X10 (GAUZE/BANDAGES/DRESSINGS) ×3
DURAPREP 26ML APPLICATOR (WOUND CARE) ×6 IMPLANT
ELECT REM PT RETURN 15FT ADLT (MISCELLANEOUS) ×3 IMPLANT
GLOVE BIOGEL M 7.0 STRL (GLOVE) IMPLANT
GLOVE BIOGEL PI IND STRL 7.5 (GLOVE) ×9 IMPLANT
GLOVE BIOGEL PI IND STRL 8.5 (GLOVE) ×1 IMPLANT
GLOVE BIOGEL PI INDICATOR 7.5 (GLOVE) ×18
GLOVE BIOGEL PI INDICATOR 8.5 (GLOVE) ×2
GLOVE ECLIPSE 8.0 STRL XLNG CF (GLOVE) ×6 IMPLANT
GLOVE ORTHO TXT STRL SZ7.5 (GLOVE) ×3 IMPLANT
GOWN SPEC L3 XXLG W/TWL (GOWN DISPOSABLE) ×3 IMPLANT
GOWN STRL REUS W/TWL 2XL LVL3 (GOWN DISPOSABLE) ×6 IMPLANT
GOWN STRL REUS W/TWL LRG LVL3 (GOWN DISPOSABLE) ×9 IMPLANT
HANDPIECE INTERPULSE COAX TIP (DISPOSABLE) ×3
MANIFOLD NEPTUNE II (INSTRUMENTS) ×3 IMPLANT
PACK TOTAL KNEE CUSTOM (KITS) ×3 IMPLANT
POSITIONER SURGICAL ARM (MISCELLANEOUS) ×3 IMPLANT
SET HNDPC FAN SPRY TIP SCT (DISPOSABLE) ×1 IMPLANT
SET PAD KNEE POSITIONER (MISCELLANEOUS) ×3 IMPLANT
SUT MNCRL AB 4-0 PS2 18 (SUTURE) ×3 IMPLANT
SUT STRATAFIX PDS+ 0 24IN (SUTURE) ×3 IMPLANT
SUT VIC AB 1 CT1 36 (SUTURE) ×3 IMPLANT
SUT VIC AB 2-0 CT1 27 (SUTURE) ×9
SUT VIC AB 2-0 CT1 TAPERPNT 27 (SUTURE) ×3 IMPLANT
SYR 50ML LL SCALE MARK (SYRINGE) ×3 IMPLANT
TRAY FOLEY CATH 14FR (SET/KITS/TRAYS/PACK) ×3 IMPLANT
WATER STERILE IRR 1000ML POUR (IV SOLUTION) ×3 IMPLANT
WRAP KNEE MAXI GEL POST OP (GAUZE/BANDAGES/DRESSINGS) ×3 IMPLANT
YANKAUER SUCT BULB TIP 10FT TU (MISCELLANEOUS) ×3 IMPLANT

## 2018-01-12 NOTE — Interval H&P Note (Signed)
History and Physical Interval Note:  01/12/2018 7:04 AM  Krystal James  has presented today for surgery, with the diagnosis of Left knee osteoarthritis  The various methods of treatment have been discussed with the patient and family. After consideration of risks, benefits and other options for treatment, the patient has consented to  Procedure(s) with comments: LEFT TOTAL KNEE ARTHROPLASTY (Left) - 70 mins as a surgical intervention .  The patient's history has been reviewed, patient examined, no change in status, stable for surgery.  I have reviewed the patient's chart and labs.  Questions were answered to the patient's satisfaction.     Shelda Pal

## 2018-01-12 NOTE — Anesthesia Procedure Notes (Signed)
Anesthesia Regional Block: Adductor canal block   Pre-Anesthetic Checklist: ,, timeout performed, Correct Patient, Correct Site, Correct Laterality, Correct Procedure, Correct Position, site marked, Risks and benefits discussed,  Surgical consent,  Pre-op evaluation,  At surgeon's request and post-op pain management  Laterality: Left  Prep: chloraprep       Needles:  Injection technique: Single-shot  Needle Type: Echogenic Needle     Needle Length: 9cm      Additional Needles:   Procedures:,,,, ultrasound used (permanent image in chart),,,,  Narrative:  Start time: 01/12/2018 6:58 AM End time: 01/12/2018 7:06 AM Injection made incrementally with aspirations every 5 mL.  Performed by: Personally  Anesthesiologist: Eilene Ghazi, MD  Additional Notes: Patient tolerated the procedure well without complications

## 2018-01-12 NOTE — Discharge Instructions (Signed)

## 2018-01-12 NOTE — Evaluation (Signed)
Physical Therapy Evaluation Patient Details Name: Krystal James MRN: [id number] DOB: 1951/05/21 Today's Date: 01/12/2018   History of Present Illness  pt s/p R TKA 6 weeks ago , still in OPPT for RTKA, and current had LTKA on 01/12/2018.   Clinical Impression  Pt is s/p TKA resulting in the deficits listed below (see PT Problem List).  Pt will benefit from acutePT to increase their independence and safety with mobility to allow discharge home with husband assisting. Pt just recent had her other knee replaced and is anxious about performing the steps to enter her home. Will require practice with her and her husband.      Follow Up Recommendations Follow surgeon's recommendation for DC plan and follow-up therapies(MD H&P states OPPT )    Equipment Recommendations  None recommended by PT(pt already has equipment)    Recommendations for Other Services       Precautions / Restrictions Precautions Precautions: Knee Restrictions Weight Bearing Restrictions: No      Mobility  Bed Mobility Overal bed mobility: Needs Assistance Bed Mobility: Supine to Sit;Sit to Supine     Supine to sit: Min assist     General bed mobility comments: OOB in recliner  Transfers Overall transfer level: Needs assistance Equipment used: Rolling walker (2 wheeled) Transfers: Sit to/from Stand Sit to Stand: Min guard         General transfer comment: increased time and safety with turns  Ambulation/Gait Ambulation/Gait assistance: Supervision;Min guard Ambulation Distance (Feet): 7 Feet(limited due to pain and light headedness ) Assistive device: Rolling walker (2 wheeled) Gait Pattern/deviations: Step-to pattern;Antalgic Gait velocity: decreased   General Gait Details: cues for sequence   Stairs Stairs: (pt has concern about steps due to recent TKA as well )          Wheelchair Mobility    Modified Rankin (Stroke Patients Only)       Balance                                              Pertinent Vitals/Pain Pain Assessment: 0-10 Pain Score: 7 (grimaces quit a bit with weight bearing with each step ) Pain Location: L knee  Pain Descriptors / Indicators: Aching;Grimacing Pain Intervention(s): Monitored during session;Ice applied    Home Living Family/patient expects to be discharged to:: Private residence Living Arrangements: Spouse/significant other Available Help at Discharge: Family Type of Home: House Home Access: Stairs to enter Entrance Stairs-Rails: Lawyer of Steps: 3 Home Layout: One level Home Equipment: Environmental consultant - 2 wheels;Cane - single point      Prior Function Level of Independence: Independent         Comments: was still attending OPPT for RTKA and using SPC as well.      Hand Dominance        Extremity/Trunk Assessment        Lower Extremity Assessment Lower Extremity Assessment: LLE deficits/detail RLE Deficits / Details: grimaced with knee flexion AAROM supine for 0-70 degrees, and not able to perform SLR independently yet.        Communication   Communication: No difficulties  Cognition Arousal/Alertness: Awake/alert Behavior During Therapy: WFL for tasks assessed/performed Overall Cognitive Status: Within Functional Limits for tasks assessed  General Comments      Exercises Total Joint Exercises Ankle Circles/Pumps: AROM;Both;Supine;10 reps Quad Sets: AROM;Left;Supine;10 reps Heel Slides: AAROM;10 reps;Supine;Left Straight Leg Raises: AAROM;Left;Supine;10 reps   Assessment/Plan    PT Assessment Patient needs continued PT services  PT Problem List Decreased range of motion;Decreased activity tolerance;Decreased mobility       PT Treatment Interventions DME instruction;Gait training;Functional mobility training;Therapeutic activities;Therapeutic exercise;Stair training    PT Goals (Current goals can be found  in the Care Plan section)  Acute Rehab PT Goals Patient Stated Goal: to be able to move more with less pain eventually PT Goal Formulation: With patient Time For Goal Achievement: 01/26/18 Potential to Achieve Goals: Good    Frequency 7X/week   Barriers to discharge        Co-evaluation               AM-PAC PT "6 Clicks" Daily Activity  Outcome Measure Difficulty turning over in bed (including adjusting bedclothes, sheets and blankets)?: Unable Difficulty moving from lying on back to sitting on the side of the bed? : Unable Difficulty sitting down on and standing up from a chair with arms (e.g., wheelchair, bedside commode, etc,.)?: Unable Help needed moving to and from a bed to chair (including a wheelchair)?: A Little Help needed walking in hospital room?: A Little Help needed climbing 3-5 steps with a railing? : A Lot 6 Click Score: 11    End of Session Equipment Utilized During Treatment: Gait belt Activity Tolerance: Patient tolerated treatment well Patient left: in chair;with call bell/phone within reach;with family/visitor present Nurse Communication: Mobility status PT Visit Diagnosis: Unsteadiness on feet (R26.81)    Time: 6578-4696 PT Time Calculation (min) (ACUTE ONLY): 23 min   Charges:   PT Evaluation $PT Eval Low Complexity: 1 Low PT Treatments $Gait Training: 8-22 mins   PT G CodesMarella James, Krystal James [phone] 01/12/2018   Krystal James, Krystal James 01/12/2018, 9:07 PM

## 2018-01-12 NOTE — Anesthesia Procedure Notes (Signed)
Date/Time: 01/12/2018 7:10 AM Performed by: Minerva Ends, CRNA Pre-anesthesia Checklist: Patient identified, Emergency Drugs available, Suction available, Patient being monitored and Timeout performed Oxygen Delivery Method: Simple face mask Placement Confirmation: positive ETCO2 and breath sounds checked- equal and bilateral Dental Injury: Teeth and Oropharynx as per pre-operative assessment  Comments: O2 for sedation -- spinal

## 2018-01-12 NOTE — Anesthesia Procedure Notes (Signed)
Date/Time: 01/12/2018 6:50 AM Performed by: Minerva Ends, CRNA Pre-anesthesia Checklist: Patient identified, Emergency Drugs available, Suction available, Patient being monitored and Timeout performed Oxygen Delivery Method: Nasal cannula Placement Confirmation: positive ETCO2 and breath sounds checked- equal and bilateral Dental Injury: Teeth and Oropharynx as per pre-operative assessment  Comments: [place] for sedation

## 2018-01-12 NOTE — Anesthesia Postprocedure Evaluation (Signed)
Anesthesia Post Note  Patient: ZISSEL BIEDERMAN  Procedure(s) Performed: LEFT TOTAL KNEE ARTHROPLASTY (Left Knee)     Patient location during evaluation: PACU Anesthesia Type: Spinal Level of consciousness: oriented and awake and alert Pain management: pain level controlled Vital Signs Assessment: post-procedure vital signs reviewed and stable Respiratory status: spontaneous breathing, respiratory function stable and patient connected to nasal cannula oxygen Cardiovascular status: blood pressure returned to baseline and stable Postop Assessment: no headache, no backache and no apparent nausea or vomiting Anesthetic complications: no    Last Vitals:  Vitals:   01/12/18 0945 01/12/18 0959  BP: 124/68 136/70  Pulse: 84 86  Resp: 14 16  Temp: 36.6 C 36.4 C  SpO2: 99% 99%    Last Pain:  Vitals:   01/12/18 1021  TempSrc:   PainSc: 7     LLE Motor Response: (P) Purposeful movement (01/12/18 1022) LLE Sensation: (P) Full sensation (01/12/18 1022) RLE Motor Response: (P) Purposeful movement (01/12/18 1022) RLE Sensation: (P) Full sensation (01/12/18 1022)      Yong Wahlquist S

## 2018-01-12 NOTE — Transfer of Care (Signed)
Immediate Anesthesia Transfer of Care Note  Patient: Krystal James [facility]  Procedure(s) Performed: LEFT TOTAL KNEE ARTHROPLASTY (Left Knee)  Patient Location: PACU  Anesthesia Type:Spinal  Level of Consciousness: awake and alert   Airway & Oxygen Therapy: Patient Spontanous Breathing and Patient connected to face mask oxygen  Post-op Assessment: Report given to RN and Post -op Vital signs reviewed and stable  Post vital signs: Reviewed and stable  Last Vitals:  Vitals Value Taken Time  BP    Temp    Pulse 88 01/12/2018  9:07 AM  Resp 15 01/12/2018  9:07 AM  SpO2 100 % 01/12/2018  9:07 AM  Vitals shown include unvalidated device data.  Last Pain:  Vitals:   01/12/18 0623  TempSrc:   PainSc: 0-No pain         Complications: No apparent anesthesia complications

## 2018-01-12 NOTE — Anesthesia Procedure Notes (Signed)
Anesthesia Procedure Image    

## 2018-01-12 NOTE — Op Note (Signed)
NAME:  [facility]                      MEDICAL RECORD NO.:  [id number]                             FACILITY:  [facility]      PHYSICIAN:  Madlyn Frankel. Charlann Boxer, M.D.  DATE OF BIRTH:  05/29/1951      DATE OF PROCEDURE:  01/12/2018                                     OPERATIVE REPORT         PREOPERATIVE DIAGNOSIS:  Left knee osteoarthritis.      POSTOPERATIVE DIAGNOSIS:  Left knee osteoarthritis.      FINDINGS:  The patient was noted to have complete loss of cartilage and   bone-on-bone arthritis with associated osteophytes in the medial and patellofemoral compartments of   the knee with a significant synovitis and associated effusion.      PROCEDURE:  Left total knee replacement.      COMPONENTS USED:  DePuy Attune rotating platform posterior stabilized knee   system, a size 3 femur, 2 tibia, size 6 mm PS AOX insert, and 32 anatomic patellar   button.      SURGEON:  Madlyn Frankel. Charlann Boxer, M.D.      ASSISTANT:  Lanney Gins, PA-C.      ANESTHESIA:  Regional and Spinal.      SPECIMENS:  None.      COMPLICATION:  None.      DRAINS:  None.  EBL: <150cc      TOURNIQUET TIME:   Total Tourniquet Time Documented: Thigh (Left) - 30 minutes Total: Thigh (Left) - 30 minutes  .      The patient was stable to the recovery room.      INDICATION FOR PROCEDURE:  Krystal James is a 67 y.o. female patient of   mine.  The patient had been seen, evaluated, and treated conservatively in the   office with medication, activity modification, and injections.  The patient had   radiographic changes of bone-on-bone arthritis with endplate sclerosis and osteophytes noted.      The patient failed conservative measures including medication, injections, and activity modification, and at this point was ready for more definitive measures.   Based on the radiographic changes and failed conservative measures, the patient   decided to proceed with total knee replacement.  Risks of infection,   DVT, component  failure, need for revision surgery, postop course, and   expectations were all   discussed and reviewed.  Consent was obtained for benefit of pain   relief.      PROCEDURE IN DETAIL:  The patient was brought to the operative theater.   Once adequate anesthesia, preoperative antibiotics, 2 gm of Ancef, 1 gm of Tranexamic Acid, and 10 mg of Decadron administered, the patient was positioned supine with the left thigh tourniquet placed.  The  left lower extremity was prepped and draped in sterile fashion.  A time-   out was performed identifying the patient, planned procedure, and   extremity.      The left lower extremity was placed in the [facility] leg holder.  The leg was   exsanguinated, tourniquet elevated to 250 mmHg.  A midline incision was  made followed by median parapatellar arthrotomy.  Following initial   exposure, attention was first directed to the patella.  Precut   measurement was noted to be 21 mm.  I resected down to 13 mm and used a   32 anatomic patellar button to restore patellar height as well as cover the cut   surface.      The lug holes were drilled and a metal shim was placed to protect the   patella from retractors and saw blades.      At this point, attention was now directed to the femur.  The femoral   canal was opened with a drill, irrigated to try to prevent fat emboli.  An   intramedullary rod was passed at 3 degrees valgus, 9 mm of bone was   resected off the distal femur.  Following this resection, the tibia was   subluxated anteriorly.  Using the extramedullary guide, 2 mm of bone was resected off   the proximal medial tibia.  We confirmed the gap would be   stable medially and laterally with a 10 mm insert as well as confirmed   the cut was perpendicular in the coronal plane, checking with an alignment rod.      Once this was done, I sized the femur to be a size 3 in the anterior-   posterior dimension, chose a standard component based on medial and    lateral dimension.  The size 3 rotation block was then pinned in   position anterior referenced using the C-clamp to set rotation.  The   anterior, posterior, and  chamfer cuts were made without difficulty nor   notching making certain that I was along the anterior cortex to help   with flexion gap stability.      The final box cut was made off the lateral aspect of distal femur.      At this point, the tibia was sized to be a size 2, the size 2 tray was   then pinned in position through the medial third of the tubercle,   drilled, and keel punched.  Trial reduction was now carried with a 3 femur,  2 tibia, a size 6 mm PS insert, and the 32 anatomic patella botton.  The knee was brought to   extension, full extension with good flexion stability with the patella   tracking through the trochlea without application of pressure.  Given   all these findings the femoral lug holes were drilled and then the trial components removed.  Final components were   opened and cement was mixed.  The knee was irrigated with normal saline   solution and pulse lavage.  The synovial lining was   then injected with 0.25% Marcaine with epinephrine and 1 cc of Toradol,   total of 61 cc.      The knee was irrigated.  Final implants were then cemented onto clean and   dried cut surfaces of bone with the knee brought to extension with a size 6 mm PS trial insert.      Once the cement had fully cured, the excess cement was removed   throughout the knee.  I confirmed I was satisfied with the range of   motion and stability, and the final size 6 mm PS AOX insert was chosen.  It was   placed into the knee.      The tourniquet had been let down at 30 minutes.  No significant   hemostasis required.  The   extensor mechanism was then reapproximated using #1 Vicryl and #1 Stratafix sutures with the knee   in flexion.  The   remaining wound was closed with 2-0 Vicryl and running 4-0 Monocryl.   The knee was cleaned,  dried, dressed sterilely using Dermabond and   Aquacel dressing.  The patient was then   brought to recovery room in stable condition, tolerating the procedure   well.   Please note that Physician Assistant, Lanney Gins, PA-C, was present for the entirety of the case, and was utilized for pre-operative positioning, peri-operative retractor management, general facilitation of the procedure.  He was also utilized for primary wound closure at the end of the case.              Madlyn Frankel Charlann Boxer, M.D.    01/12/2018 8:42 AM

## 2018-01-12 NOTE — Anesthesia Procedure Notes (Signed)
Spinal  Patient location during procedure: OR Start time: 01/12/2018 7:12 AM End time: 01/12/2018 7:18 AM Staffing Anesthesiologist: Myrtie Soman, MD Performed: anesthesiologist  Preanesthetic Checklist Completed: patient identified, site marked, surgical consent, pre-op evaluation, timeout performed, IV checked, risks and benefits discussed and monitors and equipment checked Spinal Block Patient position: sitting Prep: Betadine Patient monitoring: heart rate, continuous pulse ox and blood pressure Location: L4-5 Injection technique: single-shot Needle Needle type: Sprotte  Needle gauge: 24 G Needle length: 9 cm Additional Notes Expiration date of kit checked and confirmed. Patient tolerated procedure well, without complications.

## 2018-01-13 DIAGNOSIS — M1712 Unilateral primary osteoarthritis, left knee: Secondary | ICD-10-CM | POA: Diagnosis not present

## 2018-01-13 DIAGNOSIS — E669 Obesity, unspecified: Secondary | ICD-10-CM | POA: Diagnosis present

## 2018-01-13 LAB — CBC
HEMATOCRIT: 26.1 % — AB (ref 36.0–46.0)
Hemoglobin: 8.6 g/dL — ABNORMAL LOW (ref 12.0–15.0)
MCH: 27.3 pg (ref 26.0–34.0)
MCHC: 33 g/dL (ref 30.0–36.0)
MCV: 82.9 fL (ref 78.0–100.0)
PLATELETS: 157 10*3/uL (ref 150–400)
RBC: 3.15 MIL/uL — AB (ref 3.87–5.11)
RDW: 14 % (ref 11.5–15.5)
WBC: 8.6 10*3/uL (ref 4.0–10.5)

## 2018-01-13 LAB — GLUCOSE, CAPILLARY
Glucose-Capillary: 125 mg/dL — ABNORMAL HIGH (ref 65–99)
Glucose-Capillary: 136 mg/dL — ABNORMAL HIGH (ref 65–99)

## 2018-01-13 LAB — BASIC METABOLIC PANEL
Anion gap: 8 (ref 5–15)
BUN: 14 mg/dL (ref 6–20)
CHLORIDE: 101 mmol/L (ref 101–111)
CO2: 21 mmol/L — AB (ref 22–32)
Calcium: 9.5 mg/dL (ref 8.9–10.3)
Creatinine, Ser: 0.52 mg/dL (ref 0.44–1.00)
GFR calc Af Amer: >60 mL/min (ref >60)
GLUCOSE: 120 mg/dL — AB (ref 65–99)
POTASSIUM: 5 mmol/L (ref 3.5–5.1)
Sodium: 130 mmol/L — ABNORMAL LOW (ref 135–145)

## 2018-01-13 MED ORDER — ASPIRIN 81 MG PO CHEW: 81.0000 mg | CHEWABLE_TABLET | Freq: Two times a day (BID) | ORAL | 0 refills | Status: AC

## 2018-01-13 MED ORDER — CYCLOBENZAPRINE HCL 5 MG PO TABS: 5.0000 mg | ORAL_TABLET | Freq: Three times a day (TID) | ORAL | 0 refills | Status: DC | PRN

## 2018-01-13 NOTE — Progress Notes (Signed)
     Subjective: 1 Day Post-Op Procedure(s) (LRB): LEFT TOTAL KNEE ARTHROPLASTY (Left)   Patient reports pain as mild, pain controlled. No events throughout the night. States that the Robaxin hasn't been helping her recently, discussed changing to Flexeril.  Ready to be discharged home if she does well with PT.   Objective:   VITALS:   Vitals:   01/13/18 0149 01/13/18 0552  BP: (!) 142/63 131/65  Pulse: 78 72  Resp: 20 20  Temp: (!) 97.4 F (36.3 C) 97.8 F (36.6 C)  SpO2: 100% 99%    Dorsiflexion/Plantar flexion intact Incision: dressing C/D/I No cellulitis present Compartment soft  LABS Recent Labs    01/13/18 0602  HGB 8.6*  HCT 26.1*  WBC 8.6  PLT 157    Recent Labs    01/13/18 0602  NA 130*  K 5.0  BUN 14  CREATININE 0.52  GLUCOSE 120*     Assessment/Plan: 1 Day Post-Op Procedure(s) (LRB): LEFT TOTAL KNEE ARTHROPLASTY (Left) Foley cath d/c'ed Advance diet Up with therapy D/C IV fluids Discharge home Follow up in 2 weeks at [facility]. Follow up with OLIN,Zaylee Cornia D in 2 weeks.  Contact information:  [facility] [address], [address] [place] [address] [phone]    Obese (BMI 30-39.9) Estimated body mass index is 32.77 kg/m as calculated from the following:   Height as of this encounter:  (1.6 m).   Weight as of this encounter: 83.9 kg (185 lb). Patient also counseled that weight may inhibit the healing process Patient counseled that losing weight will help with future health issues       Anastasio Auerbach. Domenic Schoenberger   PAC  01/13/2018, 8:41 AM

## 2018-01-13 NOTE — Progress Notes (Signed)
Physical Therapy Treatment Patient Details Name: Krystal James MRN: [id number] DOB: [birth year] Today's Date: 01/13/2018    History of Present Illness pt s/p R TKA 6 weeks ago , still in OPPT for RTKA, and current had LTKA on 01/12/2018.     PT Comments    POD # 1 am session Spouse present during session.  Assisted with transfers, amb and stairs.  Pt had other knee replaced a month ago so very knowledgeable.  Given handout HEP and instructed on freq, proper tech and use of ICE.   Follow Up Recommendations  Follow surgeon's recommendation for DC plan and follow-up therapies     Equipment Recommendations  None recommended by PT    Recommendations for Other Services       Precautions / Restrictions Precautions Precautions: Knee Precaution Comments: recent R TKR Restrictions Weight Bearing Restrictions: No Other Position/Activity Restrictions: WBAT    Mobility  Bed Mobility               General bed mobility comments: OOB in recliner  Transfers Overall transfer level: Needs assistance Equipment used: Rolling walker (2 wheeled) Transfers: Sit to/from [employer] Transfers Sit to Stand: Supervision;Min guard Stand pivot transfers: Supervision;Min guard       General transfer comment: increased time and safety with turns  Ambulation/Gait Ambulation/Gait assistance: Supervision;Min guard Ambulation Distance (Feet): 55 Feet Assistive device: Rolling walker (2 wheeled) Gait Pattern/deviations: Step-to pattern;Antalgic Gait velocity: decreased   General Gait Details: instructed spouse "hands on" safety esp with turns    Stairs Stairs: Yes Stairs assistance: Supervision;Min guard Stair Management: One rail Left;Forwards;With crutches Number of Stairs: 4 General stair comments: with spouse present for "hands on" instruction on proper tech and safety.   Wheelchair Mobility    Modified Rankin (Stroke Patients Only)       Balance                                             Cognition Arousal/Alertness: Awake/alert Behavior During Therapy: WFL for tasks assessed/performed Overall Cognitive Status: Within Functional Limits for tasks assessed                                        Exercises      General Comments        Pertinent Vitals/Pain Pain Assessment: 0-10 Pain Score: 3  Pain Location: L knee  Pain Descriptors / Indicators: Aching;Grimacing;Operative site guarding Pain Intervention(s): Monitored during session;Repositioned;Ice applied;Premedicated before session    Home Living                      Prior Function            PT Goals (current goals can now be found in the care plan section)      Frequency    7X/week      PT Plan Current plan remains appropriate    Co-evaluation              AM-PAC PT "6 Clicks" Daily Activity  Outcome Measure  Difficulty turning over in bed (including adjusting bedclothes, sheets and blankets)?: A Little Difficulty moving from lying on back to sitting on the side of the bed? : A Little Difficulty sitting down on and standing up from a  chair with arms (e.g., wheelchair, bedside commode, etc,.)?: A Little Help needed moving to and from a bed to chair (including a wheelchair)?: A Little Help needed walking in hospital room?: A Little Help needed climbing 3-5 steps with a railing? : A Little 6 Click Score: 18    End of Session Equipment Utilized During Treatment: Gait belt Activity Tolerance: Patient tolerated treatment well Patient left: in chair;with call bell/phone within reach;with family/visitor present Nurse Communication: (pt readu for D/XC to home after one PT session) PT Visit Diagnosis: Unsteadiness on feet (R26.81) Pain - Right/Left: Left Pain - part of body: Knee     Time: 6962-9528 PT Time Calculation (min) (ACUTE ONLY): 29 min  Charges:  $Gait Training: 8-22 mins $Therapeutic Activity: 8-22 mins                     G Codes:       Felecia Shelling  PTA [facility]  Acute  Rehab Pager      [phone]

## 2018-01-18 NOTE — Discharge Summary (Signed)
Physician Discharge Summary  Patient ID: Krystal James MRN: [id number] DOB/AGE: 1951/04/10 67 y.o.  Admit date: 01/12/2018 Discharge date: 01/13/2018   Procedures:  Procedure(s) (LRB): LEFT TOTAL KNEE ARTHROPLASTY (Left)  Attending Physician:  Dr. Durene Romans   Admission Diagnoses:   Left knee primary OA / pain  Discharge Diagnoses:  Principal Problem:   S/P left TKA Active Problems:   Obese  Past Medical History:  Diagnosis Date  . Diverticulosis of colon   . GERD (gastroesophageal reflux disease)   . Hiatal hernia   . Hyperlipidemia   . Hypertension   . Nocturia more than twice per night   . OA (osteoarthritis)    right and left knee,  hands  . OA (osteoarthritis) of knee    right and left,    . PONV (postoperative nausea and vomiting)   . Type 2 diabetes mellitus (HCC)    followed by pcp  . Wears glasses     HPI:    Krystal James, 67 y.o. female, has a history of pain and functional disability in the left knee due to arthritis and has failed non-surgical conservative treatments for greater than 12 weeks to include NSAID's and/or analgesics, corticosteriod injections, viscosupplementation injections and activity modification.  Onset of symptoms was gradual, starting ~1 year ago with gradually worsening course since that time. The patient noted prior procedures on the knee to include  arthroplasty on the right knee(s).  Patient currently rates pain in the left knee(s) at 9 out of 10 with activity. Patient has night pain, worsening of pain with activity and weight bearing, pain that interferes with activities of daily living, pain with passive range of motion, crepitus and joint swelling.  Patient has evidence of periarticular osteophytes and joint space narrowing by imaging studies. There is no active infection.   Risks, benefits and expectations were discussed with the patient.  Risks including but not limited to the risk of anesthesia, blood clots, nerve damage, blood  vessel damage, failure of the prosthesis, infection and up to and including death.  Patient understand the risks, benefits and expectations and wishes to proceed with surgery.  PCP: Krystal Chessman, MD   Discharged Condition: good  Hospital Course:  Patient underwent the above stated procedure on 01/12/2018. Patient tolerated the procedure well and brought to the recovery room in good condition and subsequently to the floor.  POD #1 BP: 131/65 ; Pulse: 72 ; Temp: 97.8 F (36.6 C) ; Resp: 20 Patient reports pain as mild, pain controlled. No events throughout the night. States that the Robaxin hasn't been helping her recently, discussed changing to Flexeril.  Ready to be discharged home  Dorsiflexion/plantar flexion intact, incision: dressing C/D/I, no cellulitis present and compartment soft.   LABS  Basename    HGB     8.6  HCT     26.1    Discharge Exam: General appearance: alert, cooperative and no distress Extremities: Homans sign is negative, no sign of DVT, no edema, redness or tenderness in the calves or thighs and no ulcers, gangrene or trophic changes  Disposition:  Home with follow up in 2 weeks   Follow-up Information    Durene Romans, MD. Schedule an appointment as soon as possible for a visit in 2 weeks.   Specialty:  Orthopedic Surgery Contact information: [address] [place] [address] [place] [address] [phone]           Discharge Instructions    Call MD / Call 911  Complete by:  As directed    If you experience chest pain or shortness of breath, CALL 911 and be transported to the hospital emergency room.  If you develope a fever above 101 F, pus (white drainage) or increased drainage or redness at the wound, or calf pain, call your surgeon's office.   Change dressing   Complete by:  As directed    Maintain surgical dressing until follow up in the clinic. If the edges start to pull up, may reinforce with tape. If the dressing is no longer working,  may remove and cover with gauze and tape, but must keep the area dry and clean.  Call with any questions or concerns.   Constipation Prevention   Complete by:  As directed    Drink plenty of fluids.  Prune juice may be helpful.  You may use a stool softener, such as Colace (over the counter) 100 mg twice a day.  Use MiraLax (over the counter) for constipation as needed.   Diet - low sodium heart healthy   Complete by:  As directed    Discharge instructions   Complete by:  As directed    Maintain surgical dressing until follow up in the clinic. If the edges start to pull up, may reinforce with tape. If the dressing is no longer working, may remove and cover with gauze and tape, but must keep the area dry and clean.  Follow up in 2 weeks at [facility] Call with any questions or concerns.   Increase activity slowly as tolerated   Complete by:  As directed    Weight bearing as tolerated with assist device (walker, cane, etc) as directed, use it as long as suggested by your surgeon or therapist, typically at least 4-6 weeks.   TED hose   Complete by:  As directed    Use stockings (TED hose) for 2 weeks on both leg(s).  You may remove them at night for sleeping.      Allergies as of 01/13/2018      Reactions   Sulfa Antibiotics Hives      Medication List    STOP taking these medications   HYDROcodone-acetaminophen 7.5-325 MG tablet Commonly known as:  NORCO   methocarbamol 500 MG tablet Commonly known as:  ROBAXIN     TAKE these medications   acetaminophen 500 MG tablet Commonly known as:  TYLENOL Take 2 tablets (1,000 mg total) by mouth every 8 (eight) hours.   amLODipine 10 MG tablet Commonly known as:  NORVASC Take 10 mg by mouth daily. In the morning.   aspirin 81 MG chewable tablet Commonly known as:  ASPIRIN CHILDRENS Chew 1 tablet (81 mg total) by mouth 2 (two) times daily. Take for 4 weeks, then resume regular dose.   cyclobenzaprine 5 MG tablet Commonly  known as:  FLEXERIL Take 1 tablet (5 mg total) by mouth 3 (three) times daily as needed for muscle spasms.   docusate sodium 100 MG capsule Commonly known as:  COLACE Take 1 capsule (100 mg total) by mouth 2 (two) times daily.   esomeprazole 20 MG capsule Commonly known as:  NEXIUM Take 20 mg by mouth daily before breakfast.   fenofibrate 160 MG tablet Take 160 mg by mouth daily.   ferrous sulfate 325 (65 FE) MG tablet Commonly known as:  FERROUSUL Take 1 tablet (325 mg total) by mouth 3 (three) times daily with meals.   lisinopril 5 MG tablet Commonly known as:  PRINIVIL,ZESTRIL Take  5 mg by mouth daily.   metFORMIN 500 MG tablet Commonly known as:  GLUCOPHAGE Take 500 mg by mouth 3 (three) times daily with meals.   oxyCODONE 5 MG immediate release tablet Commonly known as:  Oxy IR/ROXICODONE Take 1-2 tablets (5-10 mg total) by mouth every 4 (four) hours as needed for moderate pain or severe pain.   polyethylene glycol packet Commonly known as:  MIRALAX / GLYCOLAX Take 17 g by mouth 2 (two) times daily.   rosuvastatin 10 MG tablet Commonly known as:  CRESTOR Take 10 mg by mouth daily with lunch.            Discharge Care Instructions  (From admission, onward)        Start     Ordered   01/13/18 0000  Change dressing    Comments:  Maintain surgical dressing until follow up in the clinic. If the edges start to pull up, may reinforce with tape. If the dressing is no longer working, may remove and cover with gauze and tape, but must keep the area dry and clean.  Call with any questions or concerns.   01/13/18 0857       Signed: Anastasio Auerbach. Joselyn Edling   PA-C  01/18/2018, 4:03 PM

## 2018-01-19 ENCOUNTER — Encounter: Payer: Self-pay | Admitting: Physical Therapy

## 2018-01-19 ENCOUNTER — Ambulatory Visit: Payer: 59 | Admitting: Physical Therapy

## 2018-01-19 DIAGNOSIS — M25561 Pain in right knee: Secondary | ICD-10-CM | POA: Diagnosis not present

## 2018-01-19 DIAGNOSIS — R262 Difficulty in walking, not elsewhere classified: Secondary | ICD-10-CM

## 2018-01-19 DIAGNOSIS — M25562 Pain in left knee: Secondary | ICD-10-CM

## 2018-01-19 DIAGNOSIS — M25662 Stiffness of left knee, not elsewhere classified: Secondary | ICD-10-CM

## 2018-01-19 DIAGNOSIS — R2689 Other abnormalities of gait and mobility: Secondary | ICD-10-CM

## 2018-01-19 DIAGNOSIS — M6281 Muscle weakness (generalized): Secondary | ICD-10-CM

## 2018-01-19 DIAGNOSIS — M25661 Stiffness of right knee, not elsewhere classified: Secondary | ICD-10-CM

## 2018-01-19 NOTE — Patient Instructions (Signed)
Heel Slide    Bend knee and pull heel toward buttocks. Hold __5-10__ seconds. Return. Repeat with other knee. Repeat _10-15___ times. Do __2__ sessions per day.  Quad Set   With other leg bent, foot flat, slowly tighten muscles on thigh of straight leg while counting out loud to __5__. Repeat with other leg. Repeat __10-15__ times. Do ___2_ sessions per day.  Strengthening: Straight Leg Raise (Phase 1)   Tighten muscles on front of right thigh, then lift leg __~8__ inches from surface, keeping knee locked.  Repeat __10-15__ times per set. Do __2__ sets per session.   Long [employer]   Straighten operated leg and try to hold it __5__ seconds. Use __0__ lbs on ankle. Repeat __10-15__ times. Do __2__ sessions a day.

## 2018-01-19 NOTE — Therapy (Signed)
[facility] [address]  [address] [place], [place], [address] Phone: [phone]   Fax:  [phone]  Physical Therapy Evaluation  Patient Details  Name: Krystal James MRN: [id number] Date of Birth: [birth year] Referring Provider: Dr. Charlann Boxer   Encounter Date: 01/19/2018  PT End of Session - 01/19/18 1751    Visit Number  1    Number of Visits  12    Date for PT Re-Evaluation  03/02/18    Authorization Type  UHC    Authorization - Visit Number  10    Authorization - Number of Visits  20    PT Start Time  1650    PT Stop Time  1731    PT Time Calculation (min)  41 min    Activity Tolerance  Patient tolerated treatment well    Behavior During Therapy  [facility] for tasks assessed/performed       Past Medical History:  Diagnosis Date  . Diverticulosis of colon   . GERD (gastroesophageal reflux disease)   . Hiatal hernia   . Hyperlipidemia   . Hypertension   . Nocturia more than twice per night   . OA (osteoarthritis)    right and left knee,  hands  . OA (osteoarthritis) of knee    right and left,    . PONV (postoperative nausea and vomiting)   . Type 2 diabetes mellitus (HCC)    followed by pcp  . Wears glasses     Past Surgical History:  Procedure Laterality Date  . APPENDECTOMY  age 86  . BREAST REDUCTION SURGERY Bilateral 1994  . COLONOSCOPY  2018  . ESOPHAGOGASTRODUODENOSCOPY  2017  . KNEE ARTHROSCOPY Right 2014   dr Charlann Boxer  . SHOULDER OPEN ROTATOR CUFF REPAIR Right 10-21-2001  dr Leslee Home [facility]  . 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 TOTAL KNEE ARTHROPLASTY Left 01/12/2018   Procedure: LEFT TOTAL KNEE ARTHROPLASTY;  Surgeon: Durene Romans, MD;  Location: [facility];  Service: Orthopedics;  Laterality: Left;  70 mins  . TUBAL LIGATION Bilateral yrs ago    There were no  vitals filed for this visit.   Subjective Assessment - 01/19/18 1655    Subjective  patient L TKA on 01/12/18 - heavily bruised L LE. Patient very emotional - feels this is due to medications    Patient is accompained by:  Family member husband    Pertinent History  DM, HTN    Limitations  Standing;Walking    Patient Stated Goals  improve pain - restore mobility    Currently in Pain?  Yes    Pain Score  8     Pain Location  Knee    Pain Orientation  Left    Pain Descriptors / Indicators  Aching;Tightness;Throbbing    Pain Type  Surgical pain;Acute pain    Pain Onset  1 to 4 weeks ago    Pain Frequency  Intermittent    Aggravating Factors   active movement, walking    Pain Relieving Factors  ice         [facility] PT Assessment - 01/19/18 0001      Assessment   Medical Diagnosis  L TKA 01/12/18; R TKA 12/07/17    Referring Provider  Dr. Charlann Boxer    Next MD Visit  01/26/18    Prior  Therapy  yes      Precautions   Precautions  None      Restrictions   Weight Bearing Restrictions  No      Balance Screen   Has the patient fallen in the past 6 months  No    Has the patient had a decrease in activity level because of a fear of falling?   No    Is the patient reluctant to leave their home because of a fear of falling?   No      Home Nurse, mental health  Private residence    Living Arrangements  Spouse/significant other    Type of Home  House    Home Access  Stairs to enter    Entrance Stairs-Number of Steps  4    Entrance Stairs-Rails  Right    Home Layout  One level    Home Equipment  Walker - 2 wheels;Cane - single point;Bedside commode    Additional Comments  sleeping between bed and recliner      Prior Function   Level of Independence  Independent    Vocation  Full time employment    Vocation Requirements  desk work      Cognition   Overall Cognitive Status  Within Functional Limits for tasks assessed      Sensation   Light Touch  Appears Intact       Coordination   Gross Motor Movements are Fluid and Coordinated  No limited due to pain      Posture/Postural Control   Posture/Postural Control  Postural limitations    Postural Limitations  Rounded Shoulders;Forward head      ROM / Strength   AROM / PROM / Strength  AROM;PROM;Strength      AROM   Right/Left Knee  Left    Left Knee Extension  8    Left Knee Flexion  78      PROM   PROM Assessment Site  Knee    Right/Left Knee  Left    Left Knee Extension  5    Left Knee Flexion  84      Ambulation/Gait   Ambulation/Gait  Yes    Ambulation/Gait Assistance  6: Modified independent (Device/Increase time);5: Supervision    Ambulation Distance (Feet)  90 Feet    Assistive device  Rolling walker    Gait Pattern  Step-through pattern;Decreased stride length;Antalgic;Trunk flexed;Decreased dorsiflexion - left    Ambulation Surface  Level;Indoor    Gait Comments  good step through pattern - some VC needed for upright posturing                Objective measurements completed on examination: See above findings.                PT Short Term Goals - 01/19/18 1759      PT SHORT TERM GOAL #1   Title  patient to be independent with initial HEP    Status  New    Target Date  02/02/18      PT SHORT TERM GOAL #2   Title  patient to demonstrate L and R knee PROM 0 - 100    Status  New    Target Date  02/02/18        PT Long Term Goals - 01/19/18 1800      PT LONG TERM GOAL #1   Title  patient to be independent with advanced HEP    Status  New  Target Date  03/02/18      PT LONG TERM GOAL #2   Title  patient to improve L and R knee AROM 0-110    Status  New    Target Date  03/02/18      PT LONG TERM GOAL #3   Title  patient to demonstrate heel toe gait mechanics of B LE without evidence of instability with LRAD    Status  New    Target Date  03/02/18      PT LONG TERM GOAL #4   Title  patient to demonstrate L and R LE SLR without quad lag     Status  New    Target Date  03/02/18      PT LONG TERM GOAL #5   Title  patient to demosntrate reciprocal stair navigation with single hand rail up/down 4 steps    Status  New    Target Date  03/02/18             Plan - 01/19/18 1754    Clinical Impression Statement  Krystal James s/p L TKA on 01/12/18. Patient presenitng in wheelchair, but able to ambulate using RW within clinic with good step through gait pattern, however antalgic and with reduced weight shift to operative LE. Patient very emotional in clinic today - thinks this may be due in part to change in medications. Limited tolerance to PROM today at L knee with pain limiting, however, able to perform ther ex with expected soreness and limitations s/p TKA. Patient given initial HEP for gentle strengthening with good tolerance and carryover. Patient to benefit from skilled PT to address pain and functional mobility limitations. WIll plan to heavily focus on L knee during initial visits of POC, but also incorporate R LE strength/flexibility/gait training during POC, as patient is only ~6 weeks s/p R TKA.     Rehab Potential  Good    PT Frequency  2x / week    PT Duration  6 weeks    PT Treatment/Interventions  ADLs/Self Care Home Management;Cryotherapy;Electrical Stimulation;Moist Heat;Therapeutic exercise;Therapeutic activities;Functional mobility training;Stair training;Gait training;DME Instruction;Balance training;Neuromuscular re-education;Patient/family education;Manual techniques;Vasopneumatic Device;Passive range of motion    Consulted and Agree with Plan of Care  Patient       Patient will benefit from skilled therapeutic intervention in order to improve the following deficits and impairments:  Abnormal gait, Decreased activity tolerance, Decreased balance, Decreased range of motion, Decreased mobility, Difficulty walking, Pain, Decreased strength  Visit Diagnosis: Acute pain of left knee  Acute pain of right knee  Stiffness  of left knee, not elsewhere classified  Stiffness of right knee, not elsewhere classified  Difficulty in walking, not elsewhere classified  Muscle weakness (generalized)  Other abnormalities of gait and mobility     Problem List Patient Active Problem List   Diagnosis Date Noted  . Obese 01/13/2018  . 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 S/P left TKA 12/07/2017     Kipp Laurence, PT, DPT 01/19/18 6:05 PM   [facility] [address]  [address] [place], [place], [address] Phone: [phone]   Fax:  [phone]  Name: Krystal James MRN: [id number] Date of Birth: 09-Feb-1951

## 2018-01-21 ENCOUNTER — Encounter: Payer: Self-pay | Admitting: Physical Therapy

## 2018-01-21 ENCOUNTER — Ambulatory Visit: Payer: 59 | Admitting: Physical Therapy

## 2018-01-21 DIAGNOSIS — R262 Difficulty in walking, not elsewhere classified: Secondary | ICD-10-CM

## 2018-01-21 DIAGNOSIS — R2689 Other abnormalities of gait and mobility: Secondary | ICD-10-CM

## 2018-01-21 DIAGNOSIS — M25662 Stiffness of left knee, not elsewhere classified: Secondary | ICD-10-CM

## 2018-01-21 DIAGNOSIS — M25562 Pain in left knee: Secondary | ICD-10-CM

## 2018-01-21 DIAGNOSIS — M25561 Pain in right knee: Secondary | ICD-10-CM

## 2018-01-21 DIAGNOSIS — M6281 Muscle weakness (generalized): Secondary | ICD-10-CM

## 2018-01-21 DIAGNOSIS — M25661 Stiffness of right knee, not elsewhere classified: Secondary | ICD-10-CM

## 2018-01-21 NOTE — Therapy (Signed)
[facility] [address]  [address] [place], [place], [address] Phone: [phone]   Fax:  [phone]  Physical Therapy Treatment  Patient Details  Name: Krystal James MRN: [id number] Date of Birth: 06/08/1951 Referring Provider: Dr. Charlann Boxer   Encounter Date: 01/21/2018  PT End of Session - 01/21/18 1735    Visit Number  2    Number of Visits  12    Date for PT Re-Evaluation  03/02/18    Authorization Type  UHC    Authorization - Visit Number  11    Authorization - Number of Visits  20    PT Start Time  1655    PT Stop Time  1735    PT Time Calculation (min)  40 min    Activity Tolerance  Patient tolerated treatment well    Behavior During Therapy  [facility] for tasks assessed/performed       Past Medical History:  Diagnosis Date  . Diverticulosis of colon   . GERD (gastroesophageal reflux disease)   . Hiatal hernia   . Hyperlipidemia   . Hypertension   . Nocturia more than twice per night   . OA (osteoarthritis)    right and left knee,  hands  . OA (osteoarthritis) of knee    right and left,    . PONV (postoperative nausea and vomiting)   . Type 2 diabetes mellitus (HCC)    followed by pcp  . Wears glasses     Past Surgical History:  Procedure Laterality Date  . APPENDECTOMY  age 58  . BREAST REDUCTION SURGERY Bilateral 1994  . COLONOSCOPY  2018  . ESOPHAGOGASTRODUODENOSCOPY  2017  . KNEE ARTHROSCOPY Right 2014   dr Charlann Boxer  . SHOULDER OPEN ROTATOR CUFF REPAIR Right 10-21-2001  dr Leslee Home [facility]  . 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 TOTAL KNEE ARTHROPLASTY Left 01/12/2018   Procedure: LEFT TOTAL KNEE ARTHROPLASTY;  Surgeon: Durene Romans, MD;  [facility];  Service: Orthopedics;  Laterality: Left;  70 mins  . TUBAL LIGATION Bilateral yrs ago    There were no  vitals filed for this visit.  Subjective Assessment - 01/21/18 1657    Subjective  Patient reports she didn't sleep well last night. Reports pain in L knee and restless leg syndrome. Did her HEP yesterday.    Patient Stated Goals  improve pain - restore mobility    Currently in Pain?  Yes    Pain Score  7     Pain Location  Knee    Pain Orientation  Left    Pain Descriptors / Indicators  Aching    Pain Type  Acute pain;Surgical pain                       [facility] Adult PT Treatment/Exercise - 01/21/18 0001      Knee/Hip Exercises: Stretches   Gastroc Stretch  Both;2 reps;20 seconds;Limitations    Gastroc Stretch Limitations  in sitting propped up on 8" step      Knee/Hip Exercises: Aerobic   Nustep  L3x6 min to tolerance      Knee/Hip Exercises: Standing   Hip Flexion  Both;2 sets;20 reps alt marching with B UE support on walker    Terminal Knee Extension  Strengthening;Left;1 set;10 reps;Theraband  Theraband Level (Terminal Knee Extension)  Level 4 (Blue)    Terminal Knee Extension Limitations  pt with limited tolerance      Knee/Hip Exercises: Seated   Long Arc Quad  Left;1 set;10 reps;AROM    Sit to [employer]  5 reps;with UE support;2 sets hand hold to guide forward trunk lean      Knee/Hip Exercises: Supine   Heel Slides  AROM;Left;1 set;10 reps could not tolerate OP- became nauseous                PT Short Term Goals - 01/19/18 1759      PT SHORT TERM GOAL #1   Title  patient to be independent with initial HEP    Status  New    Target Date  02/02/18      PT SHORT TERM GOAL #2   Title  patient to demonstrate L and R knee PROM 0 - 100    Status  New    Target Date  02/02/18        PT Long Term Goals - 01/19/18 1800      PT LONG TERM GOAL #1   Title  patient to be independent with advanced HEP    Status  New    Target Date  03/02/18      PT LONG TERM GOAL #2   Title  patient to improve L and R knee AROM 0-110    Status  New    Target  Date  03/02/18      PT LONG TERM GOAL #3   Title  patient to demonstrate heel toe gait mechanics of B LE without evidence of instability with LRAD    Status  New    Target Date  03/02/18      PT LONG TERM GOAL #4   Title  patient to demonstrate L and R LE SLR without quad lag    Status  New    Target Date  03/02/18      PT LONG TERM GOAL #5   Title  patient to demosntrate reciprocal stair navigation with single hand rail up/down 4 steps    Status  New    Target Date  03/02/18            Plan - 01/21/18 1736    Clinical Impression Statement  Patient arrived to clinic in transfer chair, accompanied by husband. Able to walk into clinic with rolling walker with good step-through gait patten. Attempted to perform L knee heel slides AROM- could not tolerate OP d/t pain. Patient reported nausea d/t pain and requested to sit up. Tolerated sitting quad strengthening but still c/o L knee pain. Tolerated standing TKE and marching with UE support on walker; requiring multiple sitting rest breaks during standing and sitting exercises d/t pain. Patient declined ice at end of session- reports she will go straight home and ice her knee.     PT Treatment/Interventions  ADLs/Self Care Home Management;Cryotherapy;Electrical Stimulation;Moist Heat;Therapeutic exercise;Therapeutic activities;Functional mobility training;Stair training;Gait training;DME Instruction;Balance training;Neuromuscular re-education;Patient/family education;Manual techniques;Vasopneumatic Device;Passive range of motion    Consulted and Agree with Plan of Care  Patient       Patient will benefit from skilled therapeutic intervention in order to improve the following deficits and impairments:  Abnormal gait, Decreased activity tolerance, Decreased balance, Decreased range of motion, Decreased mobility, Difficulty walking, Pain, Decreased strength  Visit Diagnosis: Acute pain of left knee  Acute pain of right knee  Stiffness of  left knee, not elsewhere  classified  Stiffness of right knee, not elsewhere classified  Difficulty in walking, not elsewhere classified  Muscle weakness (generalized)  Other abnormalities of gait and mobility     Problem List Patient Active Problem List   Diagnosis Date Noted  . Obese 01/13/2018  . 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 S/P left TKA 12/07/2017    Anette Guarneri, PT, DPT 01/21/18 5:48 PM   [facility] [address]  [address] [place], [place], [address] Phone: [phone]   Fax:  [phone]  Name: Krystal James MRN: [id number] Date of Birth: 1951-01-30

## 2018-01-26 ENCOUNTER — Ambulatory Visit: Payer: 59

## 2018-01-26 DIAGNOSIS — M25662 Stiffness of left knee, not elsewhere classified: Secondary | ICD-10-CM

## 2018-01-26 DIAGNOSIS — M25562 Pain in left knee: Secondary | ICD-10-CM

## 2018-01-26 DIAGNOSIS — M25561 Pain in right knee: Secondary | ICD-10-CM

## 2018-01-26 DIAGNOSIS — R262 Difficulty in walking, not elsewhere classified: Secondary | ICD-10-CM

## 2018-01-26 DIAGNOSIS — M25661 Stiffness of right knee, not elsewhere classified: Secondary | ICD-10-CM

## 2018-01-26 NOTE — Therapy (Signed)
[facility] [address]  [place], [place], [address] Phone: [phone]   Fax:  [phone]  Physical Therapy Treatment  Patient Details  Name: Krystal James MRN: [id number] Date of Birth: Jul 13, 1951 Referring Provider: Dr. Alvan Dame   Encounter Date: 01/26/2018  PT End of Session - 01/26/18 1705    Visit Number  3    Number of Visits  12    Date for PT Re-Evaluation  03/02/18    Authorization Type  UHC    Authorization - Visit Number  12    Authorization - Number of Visits  20    PT Start Time  1700    PT Stop Time  1739    PT Time Calculation (min)  39 min    Activity Tolerance  Patient tolerated treatment well    Behavior During Therapy  [facility] for tasks assessed/performed       Past Medical History:  Diagnosis Date  . Diverticulosis of colon   . GERD (gastroesophageal reflux disease)   . Hiatal hernia   . Hyperlipidemia   . Hypertension   . Nocturia more than twice per night   . OA (osteoarthritis)    right and left knee,  hands  . OA (osteoarthritis) of knee    right and left,    . PONV (postoperative nausea and vomiting)   . Type 2 diabetes mellitus ([place])    followed by pcp  . Wears glasses     Past Surgical History:  Procedure Laterality Date  . APPENDECTOMY  age 16  . BREAST REDUCTION SURGERY Bilateral 1994  . COLONOSCOPY  2018  . ESOPHAGOGASTRODUODENOSCOPY  2017  . KNEE ARTHROSCOPY Right 2014   dr Alvan Dame  . SHOULDER OPEN ROTATOR CUFF REPAIR Right 10-21-2001  dr Collier [facility]  . TOTAL KNEE ARTHROPLASTY Right 12/07/2017   Procedure: RIGHT TOTAL KNEE ARTHROPLASTY;  Surgeon: Paralee Cancel, MD;  [facility];  Service: Orthopedics;  Laterality: Right;  90 mins  . TOTAL KNEE ARTHROPLASTY Left 01/12/2018   Procedure: LEFT TOTAL KNEE ARTHROPLASTY;  Surgeon: Paralee Cancel, MD;  [facility];  Service: Orthopedics;  Laterality: Left;  70 mins  . TUBAL LIGATION Bilateral yrs ago    There were no  vitals filed for this visit.  Subjective Assessment - 01/26/18 1704    Subjective  Pt. reporting pain medication are allowing her to sleep through the night.      Patient is accompained by:  Family member husband    Pertinent History  DM, HTN    Patient Stated Goals  improve pain - restore mobility    Currently in Pain?  Yes    Pain Score  4     Pain Location  Knee    Pain Orientation  Left    Pain Descriptors / Indicators  Aching    Pain Type  Acute pain;Surgical pain    Pain Frequency  Intermittent    Aggravating Factors   walking, bending knee     Pain Relieving Factors  ice     Multiple Pain Sites  No         [facility] PT Assessment - 01/26/18 1734      AROM   Right/Left Knee  Left    Left Knee Extension  8    Left Knee Flexion  100      PROM   Left Knee Extension  4    Left Knee Flexion  102  [facility] Adult PT Treatment/Exercise - 01/26/18 1715      Knee/Hip Exercises: Aerobic   Nustep  L4x6 min      Knee/Hip Exercises: Machines for Strengthening   Cybex Knee Flexion  B LE's: 15# x 10 reps       Knee/Hip Exercises: Standing   Heel Raises  Both;10 reps    Terminal Knee Extension  Strengthening;Left;1 set;Theraband;15 reps    Theraband Level (Terminal Knee Extension)  Level 4 (Blue)    Terminal Knee Extension Limitations  imporved tolerance; in RW    Lateral Step Up  Left;10 reps;Hand Hold: 1;Step Height: 4"    Lateral Step Up Limitations  light HH assist from therapist     Forward Step Up  Left;10 reps;Step Height: 4";Hand Hold: 1    Forward Step Up Limitations  1 HH assist from therapist     Step Down  Left;10 reps;Step Height: 4";Hand Hold: 2 required cueing to keep hips level; eccentric control    Step Down Limitations  at machine     Functional Squat  10 reps    Functional Squat Limitations  in RW       Knee/Hip Exercises: Supine   Straight Leg Raises  Left;15 reps;Strengthening    Straight Leg Raises Limitations  Cueing for quad  set prior to each rep               PT Short Term Goals - 01/26/18 1706      PT SHORT TERM GOAL #1   Title  patient to be independent with initial HEP    Status  On-going      PT SHORT TERM GOAL #2   Title  patient to demonstrate L and R knee PROM 0 - 100    Status  Partially Met L PROM extension 5 dg         PT Long Term Goals - 01/26/18 1710      PT LONG TERM GOAL #1   Title  patient to be independent with advanced HEP    Status  On-going      PT LONG TERM GOAL #2   Title  patient to improve L and R knee AROM 0-110    Status  On-going      PT LONG TERM GOAL #3   Title  patient to demonstrate heel toe gait mechanics of B LE without evidence of instability with LRAD    Status  On-going      PT LONG TERM GOAL #4   Title  patient to demonstrate L and R LE SLR without quad lag    Status  On-going      PT LONG TERM GOAL #5   Title  patient to demosntrate reciprocal stair navigation with single hand rail up/down 4 steps    Status  On-going            Plan - 01/26/18 1710    Clinical Impression Statement  [name] seen today reporting she is sleeping better now which she attributes to stronger pain medication.  Tolerated addition of step-down, lateral step up, and forward step up well today.  Able to demo much improved ROM with flexion ROM now >100 dg in both passive and active.  Pt. to see MD tomorrow when she anticipates having bandaging removed.  Pt. declined ice to end treatment reporting she will ice at home.  Progressing well toward goals.      PT Treatment/Interventions  ADLs/Self Care Home Management;Cryotherapy;Electrical Stimulation;Moist Heat;Therapeutic exercise;Therapeutic activities;Functional  mobility training;Stair training;Gait training;DME Instruction;Balance training;Neuromuscular re-education;Patient/family education;Manual techniques;Vasopneumatic Device;Passive range of motion    Consulted and Agree with Plan of Care  Patient       Patient  will benefit from skilled therapeutic intervention in order to improve the following deficits and impairments:  Abnormal gait, Decreased activity tolerance, Decreased balance, Decreased range of motion, Decreased mobility, Difficulty walking, Pain, Decreased strength  Visit Diagnosis: Acute pain of left knee  Acute pain of right knee  Stiffness of left knee, not elsewhere classified  Stiffness of right knee, not elsewhere classified  Difficulty in walking, not elsewhere classified     Problem List Patient Active Problem List   Diagnosis Date Noted  . Obese 01/13/2018  . 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 S/P left TKA 12/07/2017    Bess Harvest, PTA 01/26/18 5:53 PM  [facility] [address]  [place], [place], [address] Phone: [phone]   Fax:  [phone]  Name: Krystal James MRN: [id number] Date of Birth: [birth year]

## 2018-01-28 ENCOUNTER — Ambulatory Visit: Payer: 59

## 2018-01-28 DIAGNOSIS — M25561 Pain in right knee: Secondary | ICD-10-CM | POA: Diagnosis not present

## 2018-01-28 DIAGNOSIS — M25562 Pain in left knee: Secondary | ICD-10-CM

## 2018-01-28 DIAGNOSIS — M25662 Stiffness of left knee, not elsewhere classified: Secondary | ICD-10-CM

## 2018-01-28 DIAGNOSIS — R262 Difficulty in walking, not elsewhere classified: Secondary | ICD-10-CM

## 2018-01-28 DIAGNOSIS — M25661 Stiffness of right knee, not elsewhere classified: Secondary | ICD-10-CM

## 2018-01-28 NOTE — Therapy (Signed)
[facility] [address]  [place], [place], [address] Phone: [phone]   Fax:  [phone]  Physical Therapy Treatment  Patient Details  Name: Krystal James MRN: [id number] Date of Birth: 05/19/1951 Referring Provider: Dr. Alvan Dame   Encounter Date: 01/28/2018  PT End of Session - 01/28/18 1707    Visit Number  4    Number of Visits  12    Date for PT Re-Evaluation  03/02/18    Authorization Type  UHC    Authorization - Visit Number  13    Authorization - Number of Visits  20    PT Start Time  1703    PT Stop Time  1746    PT Time Calculation (min)  43 min    Activity Tolerance  Patient tolerated treatment well    Behavior During Therapy  [facility] for tasks assessed/performed       Past Medical History:  Diagnosis Date  . Diverticulosis of colon   . GERD (gastroesophageal reflux disease)   . Hiatal hernia   . Hyperlipidemia   . Hypertension   . Nocturia more than twice per night   . OA (osteoarthritis)    right and left knee,  hands  . OA (osteoarthritis) of knee    right and left,    . PONV (postoperative nausea and vomiting)   . Type 2 diabetes mellitus ([place])    followed by pcp  . Wears glasses     Past Surgical History:  Procedure Laterality Date  . APPENDECTOMY  age 20  . BREAST REDUCTION SURGERY Bilateral 1994  . COLONOSCOPY  2018  . ESOPHAGOGASTRODUODENOSCOPY  2017  . KNEE ARTHROSCOPY Right 2014   dr Alvan Dame  . SHOULDER OPEN ROTATOR CUFF REPAIR Right 10-21-2001  dr Collier [facility]  . TOTAL KNEE ARTHROPLASTY Right 12/07/2017   Procedure: RIGHT TOTAL KNEE ARTHROPLASTY;  Surgeon: Paralee Cancel, MD;  Location: [facility];  Service: Orthopedics;  Laterality: Right;  90 mins  . TOTAL KNEE ARTHROPLASTY Left 01/12/2018   Procedure: LEFT TOTAL KNEE ARTHROPLASTY;  Surgeon: Paralee Cancel, MD;  Location: [facility];  Service: Orthopedics;  Laterality: Left;  70 mins  . TUBAL LIGATION Bilateral yrs ago    There were no  vitals filed for this visit.  Subjective Assessment - 01/28/18 1707    Subjective  Krystal James reporting she is sleeping through night now.      Pertinent History  DM, HTN    Patient Stated Goals  improve pain - restore mobility    Currently in Pain?  Yes    Pain Score  3     Pain Location  Knee    Pain Orientation  Left    Pain Descriptors / Indicators  Aching    Pain Type  Acute pain;Surgical pain    Pain Onset  1 to 4 weeks ago    Pain Frequency  Intermittent    Aggravating Factors   walking, bending knee    Multiple Pain Sites  No                       [facility] Adult PT Treatment/Exercise - 01/28/18 1718      Ambulation/Gait   Ambulation/Gait  Yes    Ambulation/Gait Assistance  5: Supervision    Ambulation Distance (Feet)  90 Feet    Assistive device  Straight cane    Gait Pattern  Step-through pattern;Decreased stride length;Decreased dorsiflexion - left  Ambulation Surface  Level;Indoor    Gait Comments  Pt. able to ambulate with SPC with good overall stability however did require cueing for proper sequencing with good carryover      Knee/Hip Exercises: Aerobic   Recumbent Bike  Lvl 1, full revolutions       Knee/Hip Exercises: Machines for Strengthening   Cybex Knee Flexion  B LE's: 15# x 15 reps; B con/L ecc 10# x 15 rpes        Knee/Hip Exercises: Standing   Heel Raises  15 reps    Forward Step Up  Left;10 reps;Step Height: 6";Hand Hold: 2    Forward Step Up Limitations  counter and therapist HH support     Functional Squat  15 reps;3 seconds    Functional Squat Limitations  counter - chair behind pt.       Knee/Hip Exercises: Supine   Straight Leg Raises  Left;10 reps    Straight Leg Raises Limitations  Cueing for quad set prior to each rep    Other Supine Knee/Hip Exercises  Straight leg bridge with heels on peanut p-ball x 10 eps     Other Supine Knee/Hip Exercises  L knee flexion stretch with heels on peanut p-ball 5" x 10 reps               PT Education - 01/28/18 1809    Education provided  Yes    Education Details  counter squat    Person(s) Educated  Patient    Methods  Explanation;Demonstration;Verbal cues;Handout    Comprehension  Verbalized understanding;Returned demonstration;Verbal cues required;Need further instruction       PT Short Term Goals - 01/28/18 1710      PT SHORT TERM GOAL #1   Title  patient to be independent with initial HEP    Status  Achieved      PT SHORT TERM GOAL #2   Title  patient to demonstrate L and R knee PROM 0 - 100    Status  Partially Met L PROM extension 5 dg         PT Long Term Goals - 01/26/18 1710      PT LONG TERM GOAL #1   Title  patient to be independent with advanced HEP    Status  On-going      PT LONG TERM GOAL #2   Title  patient to improve L and R knee AROM 0-110    Status  On-going      PT LONG TERM GOAL #3   Title  patient to demonstrate heel toe gait mechanics of B LE without evidence of instability with LRAD    Status  On-going      PT LONG TERM GOAL #4   Title  patient to demonstrate L and R LE SLR without quad lag    Status  On-going      PT LONG TERM GOAL #5   Title  patient to demosntrate reciprocal stair navigation with single hand rail up/down 4 steps    Status  On-going            Plan - 01/28/18 1708    Clinical Impression Statement  Pt. seen to start treatment reporting some improvement in pain levels and reports MD removed bandage at recent f/u.  Pt. tolerated advancement of squat, forward step-up well today without issue.  Pt. tolerable to trial of gait with SPC today and will likely be appropriate to transition to [facility] form RW over next  few visits with further skilled training.  Ended treatment with pt. declining ice and reporting she will ice at home.      PT Treatment/Interventions  ADLs/Self Care Home Management;Cryotherapy;Electrical Stimulation;Moist Heat;Therapeutic exercise;Therapeutic activities;Functional  mobility training;Stair training;Gait training;DME Instruction;Balance training;Neuromuscular re-education;Patient/family education;Manual techniques;Vasopneumatic Device;Passive range of motion    Consulted and Agree with Plan of Care  Patient       Patient will benefit from skilled therapeutic intervention in order to improve the following deficits and impairments:  Abnormal gait, Decreased activity tolerance, Decreased balance, Decreased range of motion, Decreased mobility, Difficulty walking, Pain, Decreased strength  Visit Diagnosis: Acute pain of left knee  Acute pain of right knee  Stiffness of left knee, not elsewhere classified  Stiffness of right knee, not elsewhere classified  Difficulty in walking, not elsewhere classified     Problem List Patient Active Problem List   Diagnosis Date Noted  . Obese 01/13/2018  . 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 S/P left TKA 12/07/2017    Bess Harvest, PTA 01/28/18 6:17 PM  [facility] [address]  [place], [place], [address] Phone: [phone]   Fax:  [phone]  Name: Krystal James MRN: [id number] Date of Birth: 1951-02-12

## 2018-02-02 ENCOUNTER — Ambulatory Visit: Payer: 59

## 2018-02-02 DIAGNOSIS — M25562 Pain in left knee: Secondary | ICD-10-CM

## 2018-02-02 DIAGNOSIS — M25561 Pain in right knee: Secondary | ICD-10-CM

## 2018-02-02 DIAGNOSIS — M25661 Stiffness of right knee, not elsewhere classified: Secondary | ICD-10-CM

## 2018-02-02 DIAGNOSIS — M25662 Stiffness of left knee, not elsewhere classified: Secondary | ICD-10-CM

## 2018-02-02 DIAGNOSIS — R262 Difficulty in walking, not elsewhere classified: Secondary | ICD-10-CM

## 2018-02-02 NOTE — Therapy (Signed)
[facility] [address]  [employer] [place], [place], [address] Phone: [phone]   Fax:  [phone]  Physical Therapy Treatment  Patient Details  Name: Krystal James MRN: [id number] Date of Birth: 01/27/1951 Referring Provider: Dr. Alvan Dame   Encounter Date: 02/02/2018  PT End of Session - 02/02/18 1720    Visit Number  5    Number of Visits  12    Date for PT Re-Evaluation  03/02/18    Authorization Type  UHC    Authorization - Visit Number  14    Authorization - Number of Visits  20    PT Start Time  1703    PT Stop Time  1747    PT Time Calculation (min)  44 min    Activity Tolerance  Patient tolerated treatment well    Behavior During Therapy  [facility] for tasks assessed/performed       Past Medical History:  Diagnosis Date  . Diverticulosis of colon   . GERD (gastroesophageal reflux disease)   . Hiatal hernia   . Hyperlipidemia   . Hypertension   . Nocturia more than twice per night   . OA (osteoarthritis)    right and left knee,  hands  . OA (osteoarthritis) of knee    right and left,    . PONV (postoperative nausea and vomiting)   . Type 2 diabetes mellitus ([place])    followed by pcp  . Wears glasses     Past Surgical History:  Procedure Laterality Date  . APPENDECTOMY  age 40  . BREAST REDUCTION SURGERY Bilateral 1994  . COLONOSCOPY  2018  . ESOPHAGOGASTRODUODENOSCOPY  2017  . KNEE ARTHROSCOPY Right 2014   dr Alvan Dame  . SHOULDER OPEN ROTATOR CUFF REPAIR Right 10-21-2001  dr Collier [facility]ina [facility]  . TOTAL KNEE ARTHROPLASTY Right 12/07/2017   Procedure: RIGHT TOTAL KNEE ARTHROPLASTY;  Surgeon: Paralee Cancel, MD;  [facility]: [facility];  Service: Orthopedics;  Laterality: Right;  90 mins  . TOTAL KNEE ARTHROPLASTY Left 01/12/2018   Procedure: LEFT TOTAL KNEE ARTHROPLASTY;  Surgeon: Paralee Cancel, MD;  [facility];  Service: Orthopedics;  Laterality: Left;  70 mins  . TUBAL LIGATION Bilateral yrs ago    There were no  vitals filed for this visit.  Subjective Assessment - 02/02/18 1722    Subjective  Krystal James reporting she has been feeling well over weekend.      Pertinent History  DM, HTN    Patient Stated Goals  improve pain - restore mobility    Currently in Pain?  Yes    Pain Score  2     Pain Location  Knee    Pain Orientation  Left    Pain Descriptors / Indicators  Aching    Pain Type  Acute pain;Surgical pain    Pain Onset  1 to 4 weeks ago    Pain Frequency  Intermittent    Aggravating Factors   walking, bending knee     Pain Relieving Factors  ice     Multiple Pain Sites  No                       [facility] Adult PT Treatment/Exercise - 02/02/18 1725      Ambulation/Gait   Ambulation/Gait  Yes    Ambulation/Gait Assistance  5: Supervision    Ambulation Distance (Feet)  90 Feet    Assistive device  Straight cane  Gait Pattern  Step-through pattern;Decreased stride length;Decreased dorsiflexion - left    Ambulation Surface  Level;Indoor    Gait Comments  Pt. able to ambulate with good overall sequencing and good heel strike and stability       Knee/Hip Exercises: Stretches   Sports administrator  Left;1 rep;60 seconds    Quad Stretch Limitations  prone with strap       Knee/Hip Exercises: Aerobic   Recumbent Bike  Lvl 1, full revolutions       Knee/Hip Exercises: Machines for Strengthening   Cybex Knee Extension  B LE's: 10# x 15 reps     Cybex Knee Flexion  B LE's: 20# x 10 reps; B con/L ecc 15# x 10 rpes      Cybex Leg Press  B LE's 20# x 15 reps      Knee/Hip Exercises: Standing   Heel Raises  15 reps    Heel Raises Limitations  at UBE    Step Down  Left;Step Height: 4";Hand Hold: 2 x 12    Step Down Limitations  at machine              PT Education - 02/02/18 1751    Education provided  Yes    Education Details  Prone quad stretch with strap     Person(s) Educated  Patient    Methods  Demonstration;Explanation;Verbal cues;Handout    Comprehension   Verbalized understanding;Returned demonstration;Verbal cues required;Need further instruction       PT Short Term Goals - 01/28/18 1710      PT SHORT TERM GOAL #1   Title  patient to be independent with initial HEP    Status  Achieved      PT SHORT TERM GOAL #2   Title  patient to demonstrate L and R knee PROM 0 - 100    Status  Partially Met L PROM extension 5 dg         PT Long Term Goals - 01/26/18 1710      PT LONG TERM GOAL #1   Title  patient to be independent with advanced HEP    Status  On-going      PT LONG TERM GOAL #2   Title  patient to improve L and R knee AROM 0-110    Status  On-going      PT LONG TERM GOAL #3   Title  patient to demonstrate heel toe gait mechanics of B LE without evidence of instability with LRAD    Status  On-going      PT LONG TERM GOAL #4   Title  patient to demonstrate L and R LE SLR without quad lag    Status  On-going      PT LONG TERM GOAL #5   Title  patient to demosntrate reciprocal stair navigation with single hand rail up/down 4 steps    Status  On-going            Plan - 02/02/18 1724    Clinical Impression Statement  Krystal James doing well today.  Able to demo improved gait pattern and stability with SPC today in treatment and instructed to begin ambulating with cane at home.   Pt. progressing very well with strengthening therex at this point and ROM visibly improved however not formally measured today.  HEP updated with prone quad stretch as pt. with tight quads, which may be limiting knee flexion ROM.  Ended treatment with pt. declining ice and with plans to ice  at home.      PT Treatment/Interventions  ADLs/Self Care Home Management;Cryotherapy;Electrical Stimulation;Moist Heat;Therapeutic exercise;Therapeutic activities;Functional mobility training;Stair training;Gait training;DME Instruction;Balance training;Neuromuscular re-education;Patient/family education;Manual techniques;Vasopneumatic Device;Passive range of motion     Consulted and Agree with Plan of Care  Patient       Patient will benefit from skilled therapeutic intervention in order to improve the following deficits and impairments:  Abnormal gait, Decreased activity tolerance, Decreased balance, Decreased range of motion, Decreased mobility, Difficulty walking, Pain, Decreased strength  Visit Diagnosis: Acute pain of left knee  Acute pain of right knee  Stiffness of left knee, not elsewhere classified  Stiffness of right knee, not elsewhere classified  Difficulty in walking, not elsewhere classified     Problem List Patient Active Problem List   Diagnosis Date Noted  . Obese 01/13/2018  . 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 S/P left TKA 12/07/2017    Bess Harvest, PTA 02/02/18 6:10 PM  [facility] [address]  [place], [place], [address] Phone: [phone]   Fax:  [phone]  Name: Krystal James MRN: [id number] Date of Birth: Sep 08, 1951

## 2018-02-04 ENCOUNTER — Ambulatory Visit: Payer: 59 | Admitting: Physical Therapy

## 2018-02-04 ENCOUNTER — Encounter: Payer: Self-pay | Admitting: Physical Therapy

## 2018-02-04 DIAGNOSIS — M25562 Pain in left knee: Secondary | ICD-10-CM

## 2018-02-04 DIAGNOSIS — M6281 Muscle weakness (generalized): Secondary | ICD-10-CM

## 2018-02-04 DIAGNOSIS — M25561 Pain in right knee: Secondary | ICD-10-CM | POA: Diagnosis not present

## 2018-02-04 DIAGNOSIS — M25662 Stiffness of left knee, not elsewhere classified: Secondary | ICD-10-CM

## 2018-02-04 DIAGNOSIS — R262 Difficulty in walking, not elsewhere classified: Secondary | ICD-10-CM

## 2018-02-04 DIAGNOSIS — M25661 Stiffness of right knee, not elsewhere classified: Secondary | ICD-10-CM

## 2018-02-04 DIAGNOSIS — R2689 Other abnormalities of gait and mobility: Secondary | ICD-10-CM

## 2018-02-04 NOTE — Therapy (Signed)
[facility] [address]  [place], [place], [address] Phone: [phone]   Fax:  [phone]  Physical Therapy Treatment  Patient Details  Name: Krystal James MRN: [id number] Date of Birth: October 26, 1950 Referring Provider: Dr. Alvan Dame   Encounter Date: 02/04/2018  PT End of Session - 02/04/18 1719    Visit Number  6    Number of Visits  12    Date for PT Re-Evaluation  03/02/18    Authorization Type  UHC    Authorization - Visit Number  15    Authorization - Number of Visits  20    PT Start Time  1701    PT Stop Time  5400    PT Time Calculation (min)  42 min    Activity Tolerance  Patient tolerated treatment well    Behavior During Therapy  [facility] for tasks assessed/performed       Past Medical History:  Diagnosis Date  . Diverticulosis of colon   . GERD (gastroesophageal reflux disease)   . Hiatal hernia   . Hyperlipidemia   . Hypertension   . Nocturia more than twice per night   . OA (osteoarthritis)    right and left knee,  hands  . OA (osteoarthritis) of knee    right and left,    . PONV (postoperative nausea and vomiting)   . Type 2 diabetes mellitus ([place])    followed by pcp  . Wears glasses     Past Surgical History:  Procedure Laterality Date  . APPENDECTOMY  age 63  . BREAST REDUCTION SURGERY Bilateral 1994  . COLONOSCOPY  2018  . ESOPHAGOGASTRODUODENOSCOPY  2017  . KNEE ARTHROSCOPY Right 2014   dr Alvan Dame  . SHOULDER OPEN ROTATOR CUFF REPAIR Right 10-21-2001  dr Collier Salina [facility]  . TOTAL KNEE ARTHROPLASTY Right 12/07/2017   Procedure: RIGHT TOTAL KNEE ARTHROPLASTY;  Surgeon: Paralee Cancel, MD;  Location: [facility];  Service: Orthopedics;  Laterality: Right;  90 mins  . TOTAL KNEE ARTHROPLASTY Left 01/12/2018   Procedure: LEFT TOTAL KNEE ARTHROPLASTY;  Surgeon: Paralee Cancel, MD;  [facility];  Service: Orthopedics;  Laterality: Left;  70 mins  . TUBAL LIGATION Bilateral yrs ago    There were no  vitals filed for this visit.  Subjective Assessment - 02/04/18 1704    Subjective  greatest complaint currently is nerve type pain at night - difficulty sleeping    Pertinent History  DM, HTN    Patient Stated Goals  improve pain - restore mobility    Currently in Pain?  Yes    Pain Score  2     Pain Location  Knee    Pain Orientation  Left    Pain Descriptors / Indicators  Burning    Pain Type  Acute pain;Surgical pain                       [facility] Adult PT Treatment/Exercise - 02/04/18 0001      Knee/Hip Exercises: Aerobic   Recumbent Bike  L1 x 6 min      Knee/Hip Exercises: Machines for Strengthening   Cybex Knee Extension  B LE - 15# x 15; B con/alternating ecc - 10# x 10    Cybex Knee Flexion  B LE - 25# x 15    Cybex Leg Press  BLE - 25# x 15      Knee/Hip Exercises: Standing   Wall Squat  10 reps;3 seconds target depth to PT thigh    Stairs  up/down 13 steps with single hand rail - step to descending, step through ascending      Knee/Hip Exercises: Seated   Other Seated Knee/Hip Exercises  B LE - fitter - 2 blue/1 Ault x 15               PT Short Term Goals - 01/28/18 1710      PT SHORT TERM GOAL #1   Title  patient to be independent with initial HEP    Status  Achieved      PT SHORT TERM GOAL #2   Title  patient to demonstrate L and R knee PROM 0 - 100    Status  Partially Met L PROM extension 5 dg         PT Long Term Goals - 01/26/18 1710      PT LONG TERM GOAL #1   Title  patient to be independent with advanced HEP    Status  On-going      PT LONG TERM GOAL #2   Title  patient to improve L and R knee AROM 0-110    Status  On-going      PT LONG TERM GOAL #3   Title  patient to demonstrate heel toe gait mechanics of B LE without evidence of instability with LRAD    Status  On-going      PT LONG TERM GOAL #4   Title  patient to demonstrate L and R LE SLR without quad lag    Status  On-going      PT LONG TERM GOAL #5    Title  patient to demosntrate reciprocal stair navigation with single hand rail up/down 4 steps    Status  On-going            Plan - 02/04/18 1747    Clinical Impression Statement  Krystal James progressing well clincially - making great progress with gait, strength, and ROM at B knees. ABle to discontinue RW today and fully transition to [facility] - likely able to wean from this very soon. TOlerable to all strengthening in session as well as gait training up/down 1 flight of stairs without issue. Making excellent progress towards goals.     PT Treatment/Interventions  ADLs/Self Care Home Management;Cryotherapy;Electrical Stimulation;Moist Heat;Therapeutic exercise;Therapeutic activities;Functional mobility training;Stair training;Gait training;DME Instruction;Balance training;Neuromuscular re-education;Patient/family education;Manual techniques;Vasopneumatic Device;Passive range of motion    Consulted and Agree with Plan of Care  Patient       Patient will benefit from skilled therapeutic intervention in order to improve the following deficits and impairments:  Abnormal gait, Decreased activity tolerance, Decreased balance, Decreased range of motion, Decreased mobility, Difficulty walking, Pain, Decreased strength  Visit Diagnosis: Acute pain of left knee  Acute pain of right knee  Stiffness of left knee, not elsewhere classified  Stiffness of right knee, not elsewhere classified  Difficulty in walking, not elsewhere classified  Muscle weakness (generalized)  Other abnormalities of gait and mobility     Problem List Patient Active Problem List   Diagnosis Date Noted  . Obese 01/13/2018  . 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 S/P left TKA 12/07/2017     Krystal James, PT, DPT 02/04/18 5:49 PM   [facility] [address]  [address] [place], [place], [address] Phone: [phone]   Fax:  [phone]  Name: Krystal James MRN:  [id number] Date of Birth: 06-02-1951

## 2018-02-09 ENCOUNTER — Ambulatory Visit: Payer: 59 | Attending: Orthopedic Surgery

## 2018-02-09 DIAGNOSIS — M25661 Stiffness of right knee, not elsewhere classified: Secondary | ICD-10-CM | POA: Diagnosis present

## 2018-02-09 DIAGNOSIS — R262 Difficulty in walking, not elsewhere classified: Secondary | ICD-10-CM | POA: Diagnosis present

## 2018-02-09 DIAGNOSIS — M25561 Pain in right knee: Secondary | ICD-10-CM | POA: Insufficient documentation

## 2018-02-09 DIAGNOSIS — R2689 Other abnormalities of gait and mobility: Secondary | ICD-10-CM | POA: Insufficient documentation

## 2018-02-09 DIAGNOSIS — M25562 Pain in left knee: Secondary | ICD-10-CM

## 2018-02-09 DIAGNOSIS — M6281 Muscle weakness (generalized): Secondary | ICD-10-CM | POA: Insufficient documentation

## 2018-02-09 DIAGNOSIS — M25662 Stiffness of left knee, not elsewhere classified: Secondary | ICD-10-CM

## 2018-02-09 NOTE — Patient Instructions (Signed)
Screening for Suicide  Answer the following questions with Yes or No and place an "x" beside the action taken.  1. Over the past two weeks, have you felt down, depressed, or hopeless?     Yes   2. Within the past two weeks, have you felt little interest or pleasure in life?     No     If YES to either #1 or #2, then ask #3  3. Have you had thoughts that that life is not worth living or that you might be          better off dead?     If answer is NO and suspicion is low, then end                YES    4. Over this past week, have you had any thoughts about hurting or even killing yourself?     YES  If NO, then end. Patient in no immediate danger   5. If so, do you believe that you intend to or will harm yourself?         Unsure      If NO, then end. Patient in no immediate danger   6.  Do you have a plan as to how you would hurt yourself?                YES    7.  Over this past week, have you actually done anything to hurt yourself?     NO    IF YES answers to either #4, #5, #6 or #7, then patient is AT RISK for suicide   Actions Taken  ____  Screening negative; no further action required  ____  Screening positive; no immediate danger and patient already in treatment with a  mental health provider. Advise patient to speak to their mental health provider.  ____  Screening positive; no immediate danger. Patient advised to contact a mental  health provider for further assessment.   ____  Screening positive; in immediate danger as patient states intention of killing self,  has plan and a sense of imminence. Do not leave alone. Seek permission from  patient to contact a family member to inform them. Direct patient to go to ED.

## 2018-02-09 NOTE — Therapy (Signed)
[facility] [address]  [place], [place], [address] Phone: [phone]   Fax:  [phone]  Physical Therapy Treatment  Patient Details  Name: Krystal James MRN: [id number] Date of Birth: October 23, 1950 Referring Provider: Dr. Alvan Dame   Encounter Date: 02/09/2018  PT End of Session - 02/09/18 1715    Visit Number  7    Number of Visits  12    Date for PT Re-Evaluation  03/02/18    Authorization Type  UHC    Authorization - Visit Number  16    Authorization - Number of Visits  20    PT Start Time  9381    PT Stop Time  1753    PT Time Calculation (min)  48 min    Activity Tolerance  Patient tolerated treatment well    Behavior During Therapy  [facility] for tasks assessed/performed       Past Medical History:  Diagnosis Date  . Diverticulosis of colon   . GERD (gastroesophageal reflux disease)   . Hiatal hernia   . Hyperlipidemia   . Hypertension   . Nocturia more than twice per night   . OA (osteoarthritis)    right and left knee,  hands  . OA (osteoarthritis) of knee    right and left,    . PONV (postoperative nausea and vomiting)   . Type 2 diabetes mellitus ([place])    followed by pcp  . Wears glasses     Past Surgical History:  Procedure Laterality Date  . APPENDECTOMY  age 59  . BREAST REDUCTION SURGERY Bilateral 1994  . COLONOSCOPY  2018  . ESOPHAGOGASTRODUODENOSCOPY  2017  . KNEE ARTHROSCOPY Right 2014   dr Alvan Dame  . SHOULDER OPEN ROTATOR CUFF REPAIR Right 10-21-2001  dr Collier [facility]  . TOTAL KNEE ARTHROPLASTY Right 12/07/2017   Procedure: RIGHT TOTAL KNEE ARTHROPLASTY;  Surgeon: Paralee Cancel, MD;  Location: [facility];  Service: Orthopedics;  Laterality: Right;  90 mins  . TOTAL KNEE ARTHROPLASTY Left 01/12/2018   Procedure: LEFT TOTAL KNEE ARTHROPLASTY;  Surgeon: Paralee Cancel, MD;  Location: [facility];  Service: Orthopedics;  Laterality: Left;  70 mins  . TUBAL LIGATION Bilateral yrs ago    There were no  vitals filed for this visit.  Subjective Assessment - 02/09/18 1712    Subjective  Pt. noting she has been struggling with depression over the last few days with pt. husband verbally confirming this.  Pt. noting she is not sleeping well.      Patient is accompained by:  Family member Husband     Pertinent History  DM, HTN    Patient Stated Goals  improve pain - restore mobility    Currently in Pain?  Yes    Pain Score  2  8/10 pain at worst    Pain Location  Knee    Pain Orientation  Left    Pain Descriptors / Indicators  Burning    Pain Type  Acute pain;Surgical pain    Pain Frequency  Intermittent    Aggravating Factors   walking, bending knee     Pain Relieving Factors  ice    Multiple Pain Sites  No                       [facility] Adult PT Treatment/Exercise - 02/09/18 1726      Ambulation/Gait   Ambulation/Gait  Yes    Ambulation/Gait Assistance  5: Supervision    Ambulation Distance (Feet)  500 Feet    Assistive device  Straight cane    Gait Pattern  Step-through pattern;Decreased stride length;Decreased dorsiflexion - left    Ambulation Surface  Level;Unlevel;Indoor;Outdoor    Gait Comments  Working on navigating stairs, curbs, and uneven surfaces outdoors       Knee/Hip Exercises: Aerobic   Recumbent Bike  L2 x 6 min      Knee/Hip Exercises: Machines for Strengthening   Cybex Knee Extension  B LE - 15# x 15  Less wt. upon pt. request     Cybex Knee Flexion  B LE - 20# 2 x 15 reps  30 sec rest btw; less wt. upon pt. request       Knee/Hip Exercises: Standing   Hip Flexion  Left;Right;10 reps;Knee straight    Hip Flexion Limitations  red TB at ankle; 2 ski poles     Hip Abduction  Right;Left;10 reps;Knee straight;Stengthening    Abduction Limitations  red TB at ankle; 2 ski poles                PT Short Term Goals - 01/28/18 1710      PT SHORT TERM GOAL #1   Title  patient to be independent with initial HEP    Status  Achieved      PT SHORT  TERM GOAL #2   Title  patient to demonstrate L and R knee PROM 0 - 100    Status  Partially Met L PROM extension 5 dg         PT Long Term Goals - 01/26/18 1710      PT LONG TERM GOAL #1   Title  patient to be independent with advanced HEP    Status  On-going      PT LONG TERM GOAL #2   Title  patient to improve L and R knee AROM 0-110    Status  On-going      PT LONG TERM GOAL #3   Title  patient to demonstrate heel toe gait mechanics of B LE without evidence of instability with LRAD    Status  On-going      PT LONG TERM GOAL #4   Title  patient to demonstrate L and R LE SLR without quad lag    Status  On-going      PT LONG TERM GOAL #5   Title  patient to demosntrate reciprocal stair navigation with single hand rail up/down 4 steps    Status  On-going            Plan - 02/09/18 1716    Clinical Impression Statement  Krystal James noting along with husband to start treatment that she has not been sleeping well, has been depressed, and attributes this to weaning self-off pain medications.  Pt. and husband both verbalizing plans to contact MD tomorrow morning regarding medications and recent bout with depression.  Suicide screen taken with pt. today with pt. being at risk for suicide.  This screen discussed with pt. husband with pt. consent and husband made aware of need to contact MD regarding these concerns.  Krystal James progressing well toward goals established LTG's in therapy and will plan to f/u with pt. and husband on 6.6 Thursday regarding contact with MD.    PT Treatment/Interventions  ADLs/Self Care Home Management;Cryotherapy;Electrical Stimulation;Moist Heat;Therapeutic exercise;Therapeutic activities;Functional mobility training;Stair training;Gait training;DME Instruction;Balance training;Neuromuscular re-education;Patient/family education;Manual techniques;Vasopneumatic Device;Passive range of motion    PT Next Visit  Plan  f/u with pt. regarding her and husband plans to  contact MD regarding depression/medication concerns    Consulted and Agree with Plan of Care  Patient       Patient will benefit from skilled therapeutic intervention in order to improve the following deficits and impairments:  Abnormal gait, Decreased activity tolerance, Decreased balance, Decreased range of motion, Decreased mobility, Difficulty walking, Pain, Decreased strength  Visit Diagnosis: Acute pain of left knee  Acute pain of right knee  Stiffness of left knee, not elsewhere classified  Stiffness of right knee, not elsewhere classified  Difficulty in walking, not elsewhere classified  Muscle weakness (generalized)  Other abnormalities of gait and mobility     Problem List Patient Active Problem List   Diagnosis Date Noted  . Obese 01/13/2018  . 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 S/P left TKA 12/07/2017    Bess Harvest, PTA 02/09/18 6:16 PM  [facility] [address]  [place], [place], [address] Phone: [phone]   Fax:  [phone]  Name: Krystal James MRN: [id number] Date of Birth: 1951/07/25

## 2018-02-11 ENCOUNTER — Ambulatory Visit: Payer: 59 | Admitting: Physical Therapy

## 2018-02-11 ENCOUNTER — Encounter: Payer: Self-pay | Admitting: Physical Therapy

## 2018-02-11 DIAGNOSIS — R2689 Other abnormalities of gait and mobility: Secondary | ICD-10-CM

## 2018-02-11 DIAGNOSIS — M6281 Muscle weakness (generalized): Secondary | ICD-10-CM

## 2018-02-11 DIAGNOSIS — M25562 Pain in left knee: Secondary | ICD-10-CM | POA: Diagnosis not present

## 2018-02-11 DIAGNOSIS — R262 Difficulty in walking, not elsewhere classified: Secondary | ICD-10-CM

## 2018-02-11 DIAGNOSIS — M25661 Stiffness of right knee, not elsewhere classified: Secondary | ICD-10-CM

## 2018-02-11 DIAGNOSIS — M25662 Stiffness of left knee, not elsewhere classified: Secondary | ICD-10-CM

## 2018-02-11 DIAGNOSIS — M25561 Pain in right knee: Secondary | ICD-10-CM

## 2018-02-11 NOTE — Therapy (Signed)
[facility] [address]  [place], [place], [address] Phone: [phone]   Fax:  [phone]  Physical Therapy Treatment  Patient Details  Name: Krystal James MRN: [id number] Date of Birth: May 14, 1951 Referring Provider: Dr. Alvan Dame   Encounter Date: 02/11/2018  PT End of Session - 02/11/18 1738    Visit Number  8    Number of Visits  12    Date for PT Re-Evaluation  03/02/18    Authorization Type  UHC    Authorization - Visit Number  17    Authorization - Number of Visits  20    PT Start Time  8546    PT Stop Time  2703    PT Time Calculation (min)  41 min    Activity Tolerance  Patient tolerated treatment well;Other (comment) patient limited by nausea    Behavior During Therapy  [facility] for tasks assessed/performed       Past Medical History:  Diagnosis Date  . Diverticulosis of colon   . GERD (gastroesophageal reflux disease)   . Hiatal hernia   . Hyperlipidemia   . Hypertension   . Nocturia more than twice per night   . OA (osteoarthritis)    right and left knee,  hands  . OA (osteoarthritis) of knee    right and left,    . PONV (postoperative nausea and vomiting)   . Type 2 diabetes mellitus ([place])    followed by pcp  . Wears glasses     Past Surgical History:  Procedure Laterality Date  . APPENDECTOMY  age 37  . BREAST REDUCTION SURGERY Bilateral 1994  . COLONOSCOPY  2018  . ESOPHAGOGASTRODUODENOSCOPY  2017  . KNEE ARTHROSCOPY Right 2014   dr Alvan Dame  . SHOULDER OPEN ROTATOR CUFF REPAIR Right 10-21-2001  dr Collier [facility]  . TOTAL KNEE ARTHROPLASTY Right 12/07/2017   Procedure: RIGHT TOTAL KNEE ARTHROPLASTY;  Surgeon: Paralee Cancel, MD;  [facility]: [facility];  Service: Orthopedics;  Laterality: Right;  90 mins  . TOTAL KNEE ARTHROPLASTY Left 01/12/2018   Procedure: LEFT TOTAL KNEE ARTHROPLASTY;  Surgeon: Paralee Cancel, MD;  [facility];  Service: Orthopedics;  Laterality: Left;  70 mins  . TUBAL  LIGATION Bilateral yrs ago    There were no vitals filed for this visit.  Subjective Assessment - 02/11/18 1659    Subjective  Reports trouble sleeping; hip has been bothering her and has been L knee burning. Reports she has been off of ocycodone for 1 week and believes she is withdrawaling.     Patient is accompained by:  Family member    Pertinent History  DM, HTN    Patient Stated Goals  improve pain - restore mobility    Currently in Pain?  Yes    Pain Score  2     Pain Location  Knee    Pain Orientation  Right;Left    Pain Descriptors / Indicators  Burning         [facility] PT Assessment - 02/11/18 0001      AROM   Left Knee Extension  5    Left Knee Flexion  120      PROM   Left Knee Extension  3    Left Knee Flexion  125                   [facility] Adult PT Treatment/Exercise - 02/11/18 0001      Knee/Hip Exercises: Aerobic  Recumbent Bike  L2 x 6 min      Knee/Hip Exercises: Machines for Strengthening   Cybex Knee Extension  B LE - 10# x 15     Cybex Knee Flexion  B LE - 20# 15 reps       Knee/Hip Exercises: Supine   Heel Slides  AROM;Left;1 set;10 reps      Manual Therapy   Manual Therapy  Joint mobilization;Soft tissue mobilization;Other (comment)    Joint Mobilization  B M/L and sup/inf patellar mobs    Soft tissue mobilization  B quads, ITB- L ITB TTP   Other Manual Therapy  scar mobilization along B knee incision sites               PT Short Term Goals - 01/28/18 1710      PT SHORT TERM GOAL #1   Title  patient to be independent with initial HEP    Status  Achieved      PT SHORT TERM GOAL #2   Title  patient to demonstrate L and R knee PROM 0 - 100    Status  Partially Met L PROM extension 5 dg         PT Long Term Goals - 01/26/18 1710      PT LONG TERM GOAL #1   Title  patient to be independent with advanced HEP    Status  On-going      PT LONG TERM GOAL #2   Title  patient to improve L and R knee AROM 0-110    Status   On-going      PT LONG TERM GOAL #3   Title  patient to demonstrate heel toe gait mechanics of B LE without evidence of instability with LRAD    Status  On-going      PT LONG TERM GOAL #4   Title  patient to demonstrate L and R LE SLR without quad lag    Status  On-going      PT LONG TERM GOAL #5   Title  patient to demosntrate reciprocal stair navigation with single hand rail up/down 4 steps    Status  On-going            Plan - 02/11/18 1739    Clinical Impression Statement  Patient arrived to appointment with report that she has had trouble sleeping d/t burning in L LE. L hip has also been bothering her. Reports her depressed feelings and suicidal thoughts have been due to coming off of her pain meds and believes it may be withdrawal. Reports she has tried hemp capsules to alleviate her depressed feelings and if they don't work for the next couple of days she will contact her MD. Patient tolerated STM to B quads and ITB- L ITB TTP but tolerable. Also tolerated B patellar mobs in M/L and superior/inferior directions. Measured L knee ROM- 5-120 AROM and 4-125 PROM. Discussed patient's progress with her and patient reporting she would like to wrap up with PT in the coming visits. Patient tolerated LE strengthening without pain but requested to cut session short d/t nausea. Reports she has not been eating much and almost cancelled today's appointment d/t not feeling well.     PT Treatment/Interventions  ADLs/Self Care Home Management;Cryotherapy;Electrical Stimulation;Moist Heat;Therapeutic exercise;Therapeutic activities;Functional mobility training;Stair training;Gait training;DME Instruction;Balance training;Neuromuscular re-education;Patient/family education;Manual techniques;Vasopneumatic Device;Passive range of motion    PT Next Visit Plan  D/C in the coming visits    Consulted and Agree with Plan of  Care  Patient       Patient will benefit from skilled therapeutic intervention in  order to improve the following deficits and impairments:  Abnormal gait, Decreased activity tolerance, Decreased balance, Decreased range of motion, Decreased mobility, Difficulty walking, Pain, Decreased strength  Visit Diagnosis: Acute pain of left knee  Acute pain of right knee  Stiffness of left knee, not elsewhere classified  Stiffness of right knee, not elsewhere classified  Difficulty in walking, not elsewhere classified  Muscle weakness (generalized)  Other abnormalities of gait and mobility     Problem List Patient Active Problem List   Diagnosis Date Noted  . Obese 01/13/2018  . 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 S/P left TKA 12/07/2017    Janene Harvey, PT, DPT 02/11/18 5:51 PM  [facility] [address]  [place], [place], [address] Phone: [phone]   Fax:  [phone]  Name: Krystal James MRN: [id number] Date of Birth: [birth year]

## 2018-02-16 ENCOUNTER — Ambulatory Visit: Payer: 59

## 2018-02-16 DIAGNOSIS — M25661 Stiffness of right knee, not elsewhere classified: Secondary | ICD-10-CM

## 2018-02-16 DIAGNOSIS — R262 Difficulty in walking, not elsewhere classified: Secondary | ICD-10-CM

## 2018-02-16 DIAGNOSIS — M6281 Muscle weakness (generalized): Secondary | ICD-10-CM

## 2018-02-16 DIAGNOSIS — M25561 Pain in right knee: Secondary | ICD-10-CM

## 2018-02-16 DIAGNOSIS — M25662 Stiffness of left knee, not elsewhere classified: Secondary | ICD-10-CM

## 2018-02-16 DIAGNOSIS — M25562 Pain in left knee: Secondary | ICD-10-CM | POA: Diagnosis not present

## 2018-02-16 DIAGNOSIS — R2689 Other abnormalities of gait and mobility: Secondary | ICD-10-CM

## 2018-02-16 NOTE — Therapy (Addendum)
[facility] [address]  [place], [place], [address] Phone: [phone]   Fax:  [phone]  Physical Therapy Treatment  Patient Details  Name: Krystal James MRN: [id number] Date of Birth: July 28, 1951 Referring Provider: Dr. Alvan Dame   Encounter Date: 02/16/2018  PT End of Session - 02/16/18 1709    Visit Number  9    Number of Visits  12    Date for PT Re-Evaluation  03/02/18    Authorization Type  UHC    Authorization - Visit Number  18    Authorization - Number of Visits  20    PT Start Time  3235    PT Stop Time  5732    PT Time Calculation (min)  42 min    Activity Tolerance  Patient tolerated treatment well;Other (comment) patient limited by nausea    Behavior During Therapy  [facility] for tasks assessed/performed       Past Medical History:  Diagnosis Date  . Diverticulosis of colon   . GERD (gastroesophageal reflux disease)   . Hiatal hernia   . Hyperlipidemia   . Hypertension   . Nocturia more than twice per night   . OA (osteoarthritis)    right and left knee,  hands  . OA (osteoarthritis) of knee    right and left,    . PONV (postoperative nausea and vomiting)   . Type 2 diabetes mellitus ([place])    followed by pcp  . Wears glasses     Past Surgical History:  Procedure Laterality Date  . APPENDECTOMY  age 67  . BREAST REDUCTION SURGERY Bilateral 1994  . COLONOSCOPY  2018  . ESOPHAGOGASTRODUODENOSCOPY  2017  . KNEE ARTHROSCOPY Right 2014   dr Alvan Dame  . SHOULDER OPEN ROTATOR CUFF REPAIR Right 10-21-2001  dr Collier [facility]  . TOTAL KNEE ARTHROPLASTY Right 12/07/2017   Procedure: RIGHT TOTAL KNEE ARTHROPLASTY;  Surgeon: Paralee Cancel, MD;  [facility];  Service: Orthopedics;  Laterality: Right;  90 mins  . TOTAL KNEE ARTHROPLASTY Left 01/12/2018   Procedure: LEFT TOTAL KNEE ARTHROPLASTY;  Surgeon: Paralee Cancel, MD;  [facility];  Service: Orthopedics;  Laterality: Left;  70 mins  . TUBAL  LIGATION Bilateral yrs ago    There were no vitals filed for this visit.  Subjective Assessment - 02/16/18 1708    Subjective  Pt. reporting she wishes to finish with therapy following today.      Patient is accompained by:  Family member husband    Pertinent History  DM, HTN    How long can you sit comfortably?  not limited    How long can you stand comfortably?  10 min     How long can you walk comfortably?  1 block     Patient Stated Goals  improve pain - restore mobility    Currently in Pain?  Yes    Pain Score  2     Pain Location  Knee    Pain Orientation  Right    Pain Descriptors / Indicators  Burning    Pain Type  Acute pain;Surgical pain    Pain Frequency  Intermittent    Aggravating Factors   walking, bending knee     Pain Relieving Factors  ice     Multiple Pain Sites  No         [facility] PT Assessment - 02/16/18 1722      AROM   AROM Assessment  Site  Knee    Right/Left Knee  Right;Left    Right Knee Extension  3    Right Knee Flexion  116    Left Knee Extension  3    Left Knee Flexion  114                   [facility] Adult PT Treatment/Exercise - 02/16/18 1754      Ambulation/Gait   Stairs  Yes    Stairs Assistance  6: Modified independent (Device/Increase time)    Stair Management Technique  One rail Right;Alternating pattern    Number of Stairs  28    Height of Stairs  8    Gait Comments  Working on eccentric control with descending however pt. able to ascend/descend without AD and 1 rail use with good stability however fatigued following 28 steps       Knee/Hip Exercises: Aerobic   Recumbent Bike  L2 x 6 min      Knee/Hip Exercises: Standing   Step Down  Right;Left;5 reps;Step Height: 6";Hand Hold: 1    Step Down Limitations  difficulty controlling eccentric     Other Standing Knee Exercises  3-way hip kicker with red looped TB at ankles x 10 reps; at counter       Knee/Hip Exercises: Supine   Straight Leg Raises  Right;Left;5 reps     Straight Leg Raises Limitations  no quad lag B               PT Short Term Goals - 02/16/18 1810      PT SHORT TERM GOAL #1   Title  patient to be independent with initial HEP    Status  Achieved      PT SHORT TERM GOAL #2   Title  patient to demonstrate L and R knee PROM 0 - 100    Status  Partially Met        PT Long Term Goals - 02/16/18 1709      PT LONG TERM GOAL #1   Title  patient to be independent with advanced HEP    Status  Partially Met      PT LONG TERM GOAL #2   Title  patient to improve L and R knee AROM 0-110    Status  Partially Met B knee extension still at 3 dg       PT LONG TERM GOAL #3   Title  patient to demonstrate heel toe gait mechanics of B LE without evidence of instability with LRAD    Status  Achieved Ambulates with good stability without AD.        PT LONG TERM GOAL #4   Title  patient to demonstrate L and R LE SLR without quad lag    Status  Achieved      PT LONG TERM GOAL #5   Title  patient to demosntrate reciprocal stair navigation with single hand rail up/down 4 steps    Status  Achieved            Plan - 02/16/18 1710    Clinical Impression Statement  Krystal James doing well today noting she contacted MD, had adjustment made to pain medication, and has seen improvement in mood and resolution of depression.  Pt. in much improved spirits today.  Able to partially meet ROM goal today and demo B SLR without quad lag.  Able to ascend/descend steps x 28 with good stability with 1 rail use and without AD  today.  Has met or partially met all therapy goals at this point.  Wishes to make today last day of therapy and supervising PT approving this with 30-day hold and pt. wishing to go on 30-day hold.  Comprehensive HEP reviewed with pt. today with HEP updated and pt. verbalizing understanding.  Pt. now on 30-day hold.      PT Treatment/Interventions  ADLs/Self Care Home Management;Cryotherapy;Electrical Stimulation;Moist Heat;Therapeutic  exercise;Therapeutic activities;Functional mobility training;Stair training;Gait training;DME Instruction;Balance training;Neuromuscular re-education;Patient/family education;Manual techniques;Vasopneumatic Device;Passive range of motion    Consulted and Agree with Plan of Care  Patient       Patient will benefit from skilled therapeutic intervention in order to improve the following deficits and impairments:  Abnormal gait, Decreased activity tolerance, Decreased balance, Decreased range of motion, Decreased mobility, Difficulty walking, Pain, Decreased strength  Visit Diagnosis: Acute pain of left knee  Acute pain of right knee  Stiffness of left knee, not elsewhere classified  Stiffness of right knee, not elsewhere classified  Difficulty in walking, not elsewhere classified  Muscle weakness (generalized)  Other abnormalities of gait and mobility     Problem List Patient Active Problem List   Diagnosis Date Noted  . Obese 01/13/2018  . 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 S/P left TKA 12/07/2017    Bess Harvest, PTA 02/16/18 6:18 PM   [facility] [address]  [place], [place], [address] Phone: [phone]   Fax:  [phone]  Name: CARLISSA PESOLA MRN: [id number] Date of Birth: Feb 23, 1951  PHYSICAL THERAPY DISCHARGE SUMMARY  Visits from Start of Care: 9  Current functional level related to goals / functional outcomes: See above clinical summary   Remaining deficits: Lacking knee extension ROM   Education / Equipment: See above  Plan: Patient agrees to discharge.  Patient goals were partially met. Patient is being discharged due to being pleased with the current functional level.  ?????     Janene Harvey, PT, DPT 03/24/18 10:34 AM

## 2018-02-18 ENCOUNTER — Ambulatory Visit: Payer: 59 | Admitting: Physical Therapy

## 2018-02-23 ENCOUNTER — Ambulatory Visit: Payer: 59 | Admitting: Physical Therapy

## 2019-01-02 ENCOUNTER — Observation Stay (HOSPITAL_COMMUNITY)
Admission: EM | Admit: 2019-01-02 | Discharge: 2019-01-03 | DRG: 177 | Disposition: A | Discharge status: Home / Self Care | Payer: 59 | Attending: Internal Medicine | Admitting: Internal Medicine

## 2019-01-02 ENCOUNTER — Emergency Department (HOSPITAL_COMMUNITY): Payer: 59

## 2019-01-02 ENCOUNTER — Encounter (HOSPITAL_COMMUNITY): Payer: Self-pay | Admitting: Emergency Medicine

## 2019-01-02 ENCOUNTER — Other Ambulatory Visit: Payer: Self-pay

## 2019-01-02 DIAGNOSIS — E781 Pure hyperglyceridemia: Secondary | ICD-10-CM | POA: Diagnosis present

## 2019-01-02 DIAGNOSIS — J1282 Pneumonia due to coronavirus disease 2019: Secondary | ICD-10-CM | POA: Diagnosis present

## 2019-01-02 DIAGNOSIS — M19042 Primary osteoarthritis, left hand: Secondary | ICD-10-CM | POA: Diagnosis present

## 2019-01-02 DIAGNOSIS — M19041 Primary osteoarthritis, right hand: Secondary | ICD-10-CM | POA: Diagnosis present

## 2019-01-02 DIAGNOSIS — K219 Gastro-esophageal reflux disease without esophagitis: Secondary | ICD-10-CM | POA: Diagnosis present

## 2019-01-02 DIAGNOSIS — Z79891 Long term (current) use of opiate analgesic: Secondary | ICD-10-CM

## 2019-01-02 DIAGNOSIS — E871 Hypo-osmolality and hyponatremia: Secondary | ICD-10-CM | POA: Diagnosis present

## 2019-01-02 DIAGNOSIS — J189 Pneumonia, unspecified organism: Secondary | ICD-10-CM

## 2019-01-02 DIAGNOSIS — Z882 Allergy status to sulfonamides status: Secondary | ICD-10-CM

## 2019-01-02 DIAGNOSIS — E1169 Type 2 diabetes mellitus with other specified complication: Secondary | ICD-10-CM | POA: Diagnosis present

## 2019-01-02 DIAGNOSIS — E785 Hyperlipidemia, unspecified: Secondary | ICD-10-CM | POA: Diagnosis present

## 2019-01-02 DIAGNOSIS — J1289 Other viral pneumonia: Secondary | ICD-10-CM | POA: Diagnosis present

## 2019-01-02 DIAGNOSIS — I10 Essential (primary) hypertension: Secondary | ICD-10-CM | POA: Diagnosis present

## 2019-01-02 DIAGNOSIS — D649 Anemia, unspecified: Secondary | ICD-10-CM

## 2019-01-02 DIAGNOSIS — Z79899 Other long term (current) drug therapy: Secondary | ICD-10-CM

## 2019-01-02 DIAGNOSIS — R0602 Shortness of breath: Secondary | ICD-10-CM | POA: Diagnosis present

## 2019-01-02 LAB — CBC WITH DIFFERENTIAL/PLATELET
Abs Immature Granulocytes: 0.02 10*3/uL (ref 0.00–0.07)
Basophils Absolute: 0 10*3/uL (ref 0.0–0.1)
Basophils Relative: 0 %
Eosinophils Absolute: 0 10*3/uL (ref 0.0–0.5)
Eosinophils Relative: 0 %
HCT: 28.5 % — ABNORMAL LOW (ref 36.0–46.0)
Hemoglobin: 9.4 g/dL — ABNORMAL LOW (ref 12.0–15.0)
Immature Granulocytes: 1 %
Lymphocytes Relative: 31 %
Lymphs Abs: 0.9 10*3/uL (ref 0.7–4.0)
MCH: 27.7 pg (ref 26.0–34.0)
MCHC: 33 g/dL (ref 30.0–36.0)
MCV: 84.1 fL (ref 80.0–100.0)
Monocytes Absolute: 0.2 10*3/uL (ref 0.1–1.0)
Monocytes Relative: 8 %
Neutro Abs: 1.7 10*3/uL (ref 1.7–7.7)
Neutrophils Relative %: 60 %
Platelets: 159 10*3/uL (ref 150–400)
RBC: 3.39 MIL/uL — ABNORMAL LOW (ref 3.87–5.11)
RDW: 13.4 % (ref 11.5–15.5)
WBC: 2.9 10*3/uL — ABNORMAL LOW (ref 4.0–10.5)
nRBC: 0 % (ref 0.0–0.2)

## 2019-01-02 LAB — COMPREHENSIVE METABOLIC PANEL
ALT: 15 U/L (ref 0–44)
AST: 28 U/L (ref 15–41)
Albumin: 3.8 g/dL (ref 3.5–5.0)
Alkaline Phosphatase: 66 U/L (ref 38–126)
Anion gap: 10 (ref 5–15)
BUN: 15 mg/dL (ref 8–23)
CO2: 20 mmol/L — ABNORMAL LOW (ref 22–32)
Calcium: 9.1 mg/dL (ref 8.9–10.3)
Chloride: 100 mmol/L (ref 98–111)
Creatinine, Ser: 0.72 mg/dL (ref 0.44–1.00)
GFR calc Af Amer: >60 mL/min (ref >60)
GFR calc non Af Amer: >60 mL/min (ref >60)
Glucose, Bld: 102 mg/dL — ABNORMAL HIGH (ref 70–99)
Potassium: 4.2 mmol/L (ref 3.5–5.1)
Sodium: 130 mmol/L — ABNORMAL LOW (ref 135–145)
Total Bilirubin: 0.2 mg/dL — ABNORMAL LOW (ref 0.3–1.2)
Total Protein: 7.1 g/dL (ref 6.5–8.1)

## 2019-01-02 LAB — SARS CORONAVIRUS 2 BY RT PCR (HOSPITAL ORDER, PERFORMED IN ~~LOC~~ HOSPITAL LAB): SARS Coronavirus 2: POSITIVE — AB

## 2019-01-02 LAB — D-DIMER, QUANTITATIVE: D-Dimer, Quant: 0.55 ug/mL-FEU — ABNORMAL HIGH (ref 0.00–0.50)

## 2019-01-02 LAB — FERRITIN: Ferritin: 65 ng/mL (ref 11–307)

## 2019-01-02 LAB — PROCALCITONIN: Procalcitonin: <0.1 ng/mL

## 2019-01-02 LAB — LACTATE DEHYDROGENASE: LDH: 133 U/L (ref 98–192)

## 2019-01-02 LAB — C-REACTIVE PROTEIN: CRP: 4.6 mg/dL — ABNORMAL HIGH (ref <1.0)

## 2019-01-02 LAB — TROPONIN I: Troponin I: <0.03 ng/mL (ref <0.03)

## 2019-01-02 LAB — LACTIC ACID, PLASMA
Lactic Acid, Venous: 1.2 mmol/L (ref 0.5–1.9)
Lactic Acid, Venous: 0.9 mmol/L (ref 0.5–1.9)

## 2019-01-02 LAB — TRIGLYCERIDES: Triglycerides: 205 mg/dL — ABNORMAL HIGH (ref <150)

## 2019-01-02 LAB — CBG MONITORING, ED: Glucose-Capillary: 81 mg/dL (ref 70–99)

## 2019-01-02 LAB — FIBRINOGEN: Fibrinogen: 598 mg/dL — ABNORMAL HIGH (ref 210–475)

## 2019-01-02 MED ORDER — SENNA 8.6 MG PO TABS
1.0000 | ORAL_TABLET | Freq: Two times a day (BID) | ORAL | Status: DC
  Filled 2019-01-02 (×2): qty 1

## 2019-01-02 MED ORDER — ZINC SULFATE 220 (50 ZN) MG PO CAPS
220.0000 mg | ORAL_CAPSULE | Freq: Every day | ORAL | Status: DC
  Administered 2019-01-02 – 2019-01-03 (×2): 220 mg via ORAL
  Filled 2019-01-02 (×2): qty 1

## 2019-01-02 MED ORDER — GABAPENTIN 300 MG PO CAPS
300.0000 mg | ORAL_CAPSULE | Freq: Three times a day (TID) | ORAL | Status: DC
  Administered 2019-01-02: 21:00:00 300 mg via ORAL
  Filled 2019-01-02 (×4): qty 1

## 2019-01-02 MED ORDER — ACETAMINOPHEN 325 MG PO TABS
650.0000 mg | ORAL_TABLET | Freq: Four times a day (QID) | ORAL | Status: DC | PRN
  Administered 2019-01-03 (×2): 650 mg via ORAL
  Filled 2019-01-02 (×2): qty 2

## 2019-01-02 MED ORDER — ONDANSETRON HCL 4 MG/2ML IJ SOLN: 4.0000 mg | Freq: Four times a day (QID) | INTRAMUSCULAR | Status: DC | PRN

## 2019-01-02 MED ORDER — MAGNESIUM HYDROXIDE 400 MG/5ML PO SUSP: 30.0000 mL | Freq: Every day | ORAL | Status: DC | PRN

## 2019-01-02 MED ORDER — VITAMIN C 500 MG PO TABS
500.0000 mg | ORAL_TABLET | Freq: Every day | ORAL | Status: DC
  Administered 2019-01-02 – 2019-01-03 (×2): 500 mg via ORAL
  Filled 2019-01-02: qty 1

## 2019-01-02 MED ORDER — SORBITOL 70 % SOLN
30.0000 mL | Freq: Every day | Status: DC | PRN
  Filled 2019-01-02: qty 30

## 2019-01-02 MED ORDER — GUAIFENESIN-DM 100-10 MG/5ML PO SYRP
10.0000 mL | ORAL_SOLUTION | ORAL | Status: DC | PRN
  Filled 2019-01-02: qty 10

## 2019-01-02 MED ORDER — ROSUVASTATIN CALCIUM 5 MG PO TABS
10.0000 mg | ORAL_TABLET | Freq: Every day | ORAL | Status: DC
  Administered 2019-01-02: 10 mg via ORAL
  Filled 2019-01-02: qty 2

## 2019-01-02 MED ORDER — HYDROCOD POLST-CPM POLST ER 10-8 MG/5ML PO SUER: 5.0000 mL | Freq: Two times a day (BID) | ORAL | Status: DC | PRN

## 2019-01-02 MED ORDER — PANTOPRAZOLE SODIUM 40 MG PO TBEC
40.0000 mg | DELAYED_RELEASE_TABLET | Freq: Every day | ORAL | Status: DC
  Administered 2019-01-02 – 2019-01-03 (×2): 40 mg via ORAL
  Filled 2019-01-02 (×2): qty 1

## 2019-01-02 MED ORDER — ALBUTEROL SULFATE HFA 108 (90 BASE) MCG/ACT IN AERS
2.0000 | INHALATION_SPRAY | Freq: Four times a day (QID) | RESPIRATORY_TRACT | Status: DC | PRN
  Filled 2019-01-02: qty 6.7

## 2019-01-02 MED ORDER — SODIUM CHLORIDE 0.9 % IV SOLN
500.0000 mg | Freq: Once | INTRAVENOUS | Status: AC
  Administered 2019-01-02: 17:00:00 500 mg via INTRAVENOUS
  Filled 2019-01-02: qty 500

## 2019-01-02 MED ORDER — FLEET ENEMA 7-19 GM/118ML RE ENEM: 1.0000 | ENEMA | Freq: Once | RECTAL | Status: DC | PRN

## 2019-01-02 MED ORDER — SODIUM CHLORIDE 0.9% FLUSH
3.0000 mL | Freq: Two times a day (BID) | INTRAVENOUS | Status: DC
  Administered 2019-01-02 – 2019-01-03 (×2): 3 mL via INTRAVENOUS

## 2019-01-02 MED ORDER — METFORMIN HCL 500 MG PO TABS
500.0000 mg | ORAL_TABLET | Freq: Three times a day (TID) | ORAL | Status: DC
  Administered 2019-01-03 (×2): 500 mg via ORAL
  Filled 2019-01-02 (×6): qty 1

## 2019-01-02 MED ORDER — ENOXAPARIN SODIUM 40 MG/0.4ML ~~LOC~~ SOLN
40.0000 mg | SUBCUTANEOUS | Status: DC
  Administered 2019-01-02: 21:00:00 40 mg via SUBCUTANEOUS
  Filled 2019-01-02: qty 0.4

## 2019-01-02 MED ORDER — LISINOPRIL 5 MG PO TABS
5.0000 mg | ORAL_TABLET | Freq: Every day | ORAL | Status: DC
  Administered 2019-01-02: 5 mg via ORAL
  Filled 2019-01-02 (×3): qty 1

## 2019-01-02 MED ORDER — AMLODIPINE BESYLATE 5 MG PO TABS
10.0000 mg | ORAL_TABLET | Freq: Every day | ORAL | Status: DC
  Administered 2019-01-02: 10 mg via ORAL
  Filled 2019-01-02 (×2): qty 2

## 2019-01-02 MED ORDER — ACETAMINOPHEN 650 MG RE SUPP: 650.0000 mg | Freq: Four times a day (QID) | RECTAL | Status: DC | PRN

## 2019-01-02 MED ORDER — ENSURE ENLIVE PO LIQD
237.0000 mL | Freq: Two times a day (BID) | ORAL | Status: DC
  Filled 2019-01-02 (×3): qty 237

## 2019-01-02 MED ORDER — ONDANSETRON HCL 4 MG PO TABS: 4.0000 mg | ORAL_TABLET | Freq: Four times a day (QID) | ORAL | Status: DC | PRN

## 2019-01-02 MED ORDER — SODIUM CHLORIDE 0.9 % IV SOLN
1.0000 g | Freq: Once | INTRAVENOUS | Status: AC
  Administered 2019-01-02: 1 g via INTRAVENOUS
  Filled 2019-01-02: qty 10

## 2019-01-02 MED ORDER — FENOFIBRATE 160 MG PO TABS
160.0000 mg | ORAL_TABLET | Freq: Every day | ORAL | Status: DC
  Administered 2019-01-02 – 2019-01-03 (×2): 160 mg via ORAL
  Filled 2019-01-02 (×4): qty 1

## 2019-01-02 NOTE — ED Notes (Signed)
Bed: UX83 Expected date:  Expected time:  Means of arrival:  Comments: Covid + low sats

## 2019-01-02 NOTE — ED Notes (Signed)
Pt game herself ibuprofen this morning per EMS.

## 2019-01-02 NOTE — H&P (Signed)
[facility] History and Physical  Krystal James:[id number] DOB: [birth year] DOA: 01/02/2019 Referring physician: ED PCP: Angelica Chessman, MD  Chief Complaint: Shortness of breath, cough ------------------------------------------------------------------------------------------------------ Assessment/Plan: Active Problems:   Pneumonia due to COVID-19 virus   Essential hypertension   Type 2 diabetes mellitus with hyperlipidemia (HCC)  COVID-19 pneumonia Date: @TODAY @ 4:19 PM  Patient Isolation: Droplet/Contact HCP PPE: Wearing all recommended PPE N95 mask + eye protection + Gown + Gloves + Surgical Cap Patient PPE: facemask  - Currently low risk on no oxygen supplementation at rest - Chest x-ray showed findings concerning for developing multilobar pneumonia in both lungs. - LDH, Ferritin, d-dimer, procalcitonin, Troponin, CRP levels obtain.  LDH normal at 133, troponin normal, ferritin normal, CRP elevated to 4.6, lactic acid normal at 1.2, procalcitonin normal at <0.1.  Follow ordered for next 3 days as well. - Monitor in COVID unit.  - Droplet/contact isolation - Start antitussives and inhalers. - Keep prone for 16 hours a day. - We will continue to monitor respiratory symptoms.  Hypertension - Continue amlodipine and lisinopril  Diabetes mellitus -Continue metformin.  Blood glucose level 102 on initial blood work.  To minimize exposure of the staff, I would not order for any sliding-scale insulin at this time.  Consider starting sliding scale if blood glucose level on morning blood work is elevated.  Hyperlipidemia/hypertriglyceridemia -Continue Crestor and fenofibrate  Mobility: Encourage ambulation resume Diet: Diabetic diet DVT prophylaxis:  Lovenox Code Status:  Full code Disposition Plan:  Depending on clinical course.  If improved, ultimately home.  -----------------------------------------------------------------------------------------------------  History of Present Illness: Krystal James is a 68 y.o. female with history of hypertension, type 2 diabetes mellitus hyperlipidemia, GERD, diverticulosis.  Her husband recently discharged from [facility] after hospitalization for COVID-19 infection.  Patient lives at the same house with her husband and might have gotten herself exposed to him.  Patient also feels that she might have actually started having symptoms before him.  Her symptoms are mainly dry cough and shortness of breath on exertion.  No chest pain.  No fever.  No diarrhea or altered mentation. Today, her cough was particularly worse and she called EMS.  In the ED, patient was afebrile, heart rate in 60s, blood pressure 165/92, at rest patient is breathing comfortably on room air. COVID-19 test positive. Labs showed sodium level low at 130, serum bicarb low at 20, LDH normal at 133, troponin normal, ferritin normal, CRP elevated to 4.6, lactic acid normal at 1.2, procalcitonin normal at <0.1.  WBC count low at 2.9, hemoglobin low at 9.4 which seems to be at baseline.  D-dimer slightly elevated at 0.55, fibrinogen elevated to 598. Chest x-ray showed spectrum of findings concerning for developing multilobar pneumonia in both lungs. Blood culture sent from ED.  Review of Systems:  All systems were reviewed and were negative unless otherwise mentioned in the HPI   Past medical history: Past Medical History:  Diagnosis Date  . Diverticulosis of colon   . GERD (gastroesophageal reflux disease)   . Hiatal hernia   . Hyperlipidemia   . Hypertension   . Nocturia more than twice per night   . OA (osteoarthritis)    right and left knee,  hands  . OA (osteoarthritis) of knee    right and left,    . PONV (postoperative nausea and vomiting)   . Type 2 diabetes mellitus (HCC)    followed by pcp  . Wears glasses  Past surgical history: Past Surgical History:  Procedure Laterality Date  . APPENDECTOMY  age 68  .  BREAST REDUCTION SURGERY Bilateral 1994  . COLONOSCOPY  2018  . ESOPHAGOGASTRODUODENOSCOPY  2017  . KNEE ARTHROSCOPY Right 2014   dr Charlann Boxerolin  . SHOULDER OPEN ROTATOR CUFF REPAIR Right [facility]2001  [facility]  . TOTAL KNEE ARTHROPLASTY Right 12/07/2017   Procedure: RIGHT TOTAL KNEE ARTHROPLASTY;  Surgeon: Durene Romanslin, Matthew, MD;  Location: [facility] ORS;  Service: Orthopedics;  Laterality: Right;  90 mins  . TOTAL KNEE ARTHROPLASTY Left 01/12/2018   Procedure: LEFT TOTAL KNEE ARTHROPLASTY;  Surgeon: Durene Romanslin, Matthew, MD;  Location: [facility];  Service: Orthopedics;  Laterality: Left;  70 mins  . TUBAL LIGATION Bilateral yrs ago    Social History:  reports that she has never smoked. She has never used smokeless tobacco. She reports that she does not drink alcohol or use drugs.  Allergies:  Allergies  Allergen Reactions  . Sulfa Antibiotics Hives    Family history:  History reviewed. No pertinent family history.   Home Meds: Prior to Admission medications   Medication Sig Start Date End Date Taking? Authorizing Provider  amLODipine (NORVASC) 10 MG tablet Take 10 mg by mouth daily. In the morning.   Yes [provider]  esomeprazole (NEXIUM) 20 MG capsule Take 20 mg by mouth daily before breakfast.   Yes [provider]  fenofibrate 160 MG tablet Take 160 mg by mouth daily.   Yes [provider]  gabapentin (NEURONTIN) 300 MG capsule Take 300 mg by mouth 3 (three) times daily. 12/31/18  Yes [provider]  lisinopril (PRINIVIL,ZESTRIL) 5 MG tablet Take 5 mg by mouth daily.   Yes [provider]  metFORMIN (GLUCOPHAGE) 500 MG tablet Take 500 mg by mouth 3 (three) times daily with meals.   Yes [provider]  rosuvastatin (CRESTOR) 10 MG tablet Take 10 mg by mouth daily with lunch.   Yes [provider]    Physical Exam: Vitals:   01/02/19 1330 01/02/19 1415 01/02/19 1419 01/02/19 1545  BP: 122/71 138/71  (!) 165/92  Pulse: 71 67  66   Resp: 16 17  (!) 21  Temp:   98.1 F (36.7 C)   TempSrc:   Oral   SpO2: 96% 98%  95%  Weight:      Height:       Wt Readings from Last 3 Encounters:  01/02/19 91.6 kg  01/12/18 83.9 kg  01/06/18 83.9 kg   Body mass index is 35.22 kg/m.  General exam: Comfortable at rest, propped up.  Not on oxygen supplementation. Skin: No rashes, lesions or ulcers. HEENT: Normal Lungs: Clear to auscultation bilaterally CVS: Regular rate and rhythm, no murmur GI/Abd soft, nondistended, nontender, bowel sound present CNS: Alert, awake oriented x3 Psychiatry: Mood & affect appropriate.  Extremities: No pedal edema, no calf tenderness  Labs on Admission:   CBC: Recent Labs  Lab 01/02/19 1327  WBC 2.9*  NEUTROABS 1.7  HGB 9.4*  HCT 28.5*  MCV 84.1  PLT 159    Basic Metabolic Panel: Recent Labs  Lab 01/02/19 1327  NA 130*  K 4.2  CL 100  CO2 20*  GLUCOSE 102*  BUN 15  CREATININE 0.72  CALCIUM 9.1    Liver Function Tests: Recent Labs  Lab 01/02/19 1327  AST 28  ALT 15  ALKPHOS 66  BILITOT 0.2*  PROT 7.1  ALBUMIN 3.8   No results for input(s): LIPASE,  AMYLASE in the last 168 hours. No results for input(s): AMMONIA in the last 168 hours.  Cardiac Enzymes: Recent Labs  Lab 01/02/19 1327  TROPONINI <0.03    BNP (last 3 results) No results for input(s): BNP in the last 8760 hours.  ProBNP (last 3 results) No results for input(s): PROBNP in the last 8760 hours.  CBG: No results for input(s): GLUCAP in the last 168 hours.  Lipase  No results found for: LIPASE   Urinalysis No results found for: COLORURINE, APPEARANCEUR, LABSPEC, PHURINE, GLUCOSEU, HGBUR, BILIRUBINUR, KETONESUR, PROTEINUR, UROBILINOGEN, NITRITE, LEUKOCYTESUR   Drugs of Abuse  No results found for: LABOPIA, COCAINSCRNUR, LABBENZ, AMPHETMU, THCU, LABBARB    Radiological Exams on Admission: Dg Chest Port 1 View  Result Date: 01/02/2019 CLINICAL DATA:  68 year old female with history of  cough and shortness of breath. EXAM: PORTABLE CHEST 1 VIEW COMPARISON:  No priors. FINDINGS: Patchy areas of interstitial prominence an ill-defined airspace disease in the lungs bilaterally, most evident throughout the mid to lower lungs. No pleural effusions. Pulmonary vasculature does not appear engorged. Heart size appears mildly enlarged. Upper mediastinal contours are within normal limits. IMPRESSION: 1. Spectrum of findings concerning for developing multilobar pneumonia in both lungs, as above. 2. Cardiomegaly, without frank pulmonary venous congestion to indicate to the above findings are related to cardiogenic pulmonary edema. Electronically Signed   By: Trudie Reed M.D.   On: 01/02/2019 14:56   ----------------------------------------------------------------------------------------------------------------------------------------------------------- Severity of Illness: The appropriate patient status for this patient is INPATIENT. Inpatient status is judged to be reasonable and necessary in order to provide the required intensity of service to ensure the patient's safety. The patient's presenting symptoms, physical exam findings, and initial radiographic and laboratory data in the context of their chronic comorbidities is felt to place them at high risk for further clinical deterioration. Furthermore, it is not anticipated that the patient will be medically stable for discharge from the hospital within 2 midnights of admission. The following factors support the patient status of inpatient.   " The patient's presenting symptoms include shortness of breath. " The worrisome physical exam findings include cough. " The initial radiographic and laboratory data are worrisome because of COVID-19 positive. " The chronic co-morbidities include hypertension, diabetes.   * I certify that at the point of admission it is my clinical judgment that the patient will require inpatient hospital care spanning  beyond 2 midnights from the point of admission due to high intensity of service, high risk for further deterioration and high frequency of surveillance required.*   Signed, Lorin Glass, MD [facility] 01/02/2019

## 2019-01-02 NOTE — ED Provider Notes (Signed)
[facility]-EMERGENCY DEPT Provider Note   CSN: [id number] Arrival date & time: 01/02/19  1310    History   Chief Complaint Chief Complaint  Patient presents with  . Shortness of Breath  . Cough    HPI Krystal James is a 68 y.o. female.  She is complaining of 2 days of cough chest pain with cough nonproductive shortness of breath nausea diarrhea headaches.  She has had a low-grade fever no higher than 100.0.  Her husband was Covid positive was admitted to the hospital and is recently returned home.  She has been living in the same house although they are in different rooms.  She is tried Mucinex with some improvement.  He has decreased appetite.  She is feeling weak.     The history is provided by the patient.  Shortness of Breath  Severity:  Moderate Onset quality:  Gradual Duration:  2 days Timing:  Constant Progression:  Worsening Chronicity:  New Context: activity   Relieved by:  None tried Worsened by:  Activity Ineffective treatments:  None tried Associated symptoms: chest pain, cough, fever and headaches   Associated symptoms: no abdominal pain, no hemoptysis, no rash, no sore throat, no sputum production, no syncope, no vomiting and no wheezing   Risk factors: no tobacco use   Cough  Associated symptoms: chest pain, fever, headaches, myalgias, rhinorrhea and shortness of breath   Associated symptoms: no rash, no sore throat and no wheezing     Past Medical History:  Diagnosis Date  . Diverticulosis of colon   . GERD (gastroesophageal reflux disease)   . Hiatal hernia   . Hyperlipidemia   . Hypertension   . Nocturia more than twice per night   . OA (osteoarthritis)    right and left knee,  hands  . OA (osteoarthritis) of knee    right and left,    . PONV (postoperative nausea and vomiting)   . Type 2 diabetes mellitus (HCC)    followed by pcp  . Wears glasses     Patient Active Problem List   Diagnosis Date Noted  . Obese  01/13/2018  . 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 S/P left TKA 12/07/2017    Past Surgical History:  Procedure Laterality Date  . APPENDECTOMY  age 68  . BREAST REDUCTION SURGERY Bilateral 1994  . COLONOSCOPY  2018  . ESOPHAGOGASTRODUODENOSCOPY  2017  . KNEE ARTHROSCOPY Right 2014   dr Charlann Boxerolin  . SHOULDER OPEN ROTATOR CUFF REPAIR Right 10-21-2001  [facility]  . TOTAL KNEE ARTHROPLASTY Right 12/07/2017   Procedure: RIGHT TOTAL KNEE ARTHROPLASTY;  Surgeon: Durene Romanslin, Matthew, MD;  Location: [facility] ORS;  Service: Orthopedics;  Laterality: Right;  90 mins  . TOTAL KNEE ARTHROPLASTY Left 01/12/2018   Procedure: LEFT TOTAL KNEE ARTHROPLASTY;  Surgeon: Durene Romanslin, Matthew, MD;  Location: [facility];  Service: Orthopedics;  Laterality: Left;  70 mins  . TUBAL LIGATION Bilateral yrs ago     OB History   No obstetric history on file.      Home Medications    Prior to Admission medications   Medication Sig Start Date End Date Taking? Authorizing Provider  acetaminophen (TYLENOL) 500 MG tablet Take 2 tablets (1,000 mg total) by mouth every 8 (eight) hours. Patient not taking: Reported on 01/19/2018 01/12/18   Lanney GinsBabish, Matthew, PA-C  amLODipine (NORVASC) 10 MG tablet Take 10 mg by mouth daily. In the morning.    [provider]  cyclobenzaprine (FLEXERIL) 5  MG tablet Take 1 tablet (5 mg total) by mouth 3 (three) times daily as needed for muscle spasms. 01/13/18   Lanney Gins, PA-C  docusate sodium (COLACE) 100 MG capsule Take 1 capsule (100 mg total) by mouth 2 (two) times daily. 01/12/18   Lanney Gins, PA-C  esomeprazole (NEXIUM) 20 MG capsule Take 20 mg by mouth daily before breakfast.    [provider]  fenofibrate 160 MG tablet Take 160 mg by mouth daily.    [provider]  ferrous sulfate (FERROUSUL) 325 (65 FE) MG tablet Take 1 tablet (325 mg total) by mouth 3 (three) times daily with meals. 01/12/18   Lanney Gins, PA-C  lisinopril (PRINIVIL,ZESTRIL) 5 MG tablet Take 5 mg by mouth  daily.    [provider]  metFORMIN (GLUCOPHAGE) 500 MG tablet Take 500 mg by mouth 3 (three) times daily with meals.    [provider]  oxyCODONE (OXY IR/ROXICODONE) 5 MG immediate release tablet Take 1-2 tablets (5-10 mg total) by mouth every 4 (four) hours as needed for moderate pain or severe pain. 01/12/18   Lanney Gins, PA-C  polyethylene glycol (MIRALAX / GLYCOLAX) packet Take 17 g by mouth 2 (two) times daily. 01/12/18   Lanney Gins, PA-C  rosuvastatin (CRESTOR) 10 MG tablet Take 10 mg by mouth daily with lunch.    [provider]    Family History No family history on file.  Social History Social History   Tobacco Use  . Smoking status: Never Smoker  . Smokeless tobacco: Never Used  Substance Use Topics  . Alcohol use: No  . Drug use: No     Allergies   Sulfa antibiotics   Review of Systems Review of Systems  Constitutional: Positive for appetite change and fever.  HENT: Positive for rhinorrhea. Negative for sore throat.   Eyes: Negative for visual disturbance.  Respiratory: Positive for cough and shortness of breath. Negative for hemoptysis, sputum production and wheezing.   Cardiovascular: Positive for chest pain. Negative for syncope.  Gastrointestinal: Positive for diarrhea and nausea. Negative for abdominal pain and vomiting.  Genitourinary: Negative for dysuria.  Musculoskeletal: Positive for myalgias.  Skin: Negative for rash.  Neurological: Positive for headaches.     Physical Exam Updated Vital Signs BP 115/72   Pulse 71   Temp (!) 97.3 F (36.3 C) (Oral)   Resp (!) 22   Ht 5' 3.5" (1.613 m)   Wt 88.4 kg   SpO2 96%   BMI 33.98 kg/m   Physical Exam Vitals signs and nursing note reviewed.  Constitutional:      General: She is not in acute distress.    Appearance: She is well-developed.  HENT:     Head: Normocephalic and atraumatic.  Eyes:     Conjunctiva/sclera: Conjunctivae normal.  Neck:      Musculoskeletal: Neck supple.  Cardiovascular:     Rate and Rhythm: Normal rate and regular rhythm.     Heart sounds: No murmur.  Pulmonary:     Effort: Pulmonary effort is normal. No respiratory distress.     Breath sounds: Normal breath sounds. No rhonchi.  Abdominal:     Palpations: Abdomen is soft.     Tenderness: There is no abdominal tenderness.  Musculoskeletal: Normal range of motion.     Right lower leg: She exhibits no tenderness.     Left lower leg: She exhibits no tenderness.  Skin:    General: Skin is warm and dry.  Capillary Refill: Capillary refill takes less than 2 seconds.  Neurological:     General: No focal deficit present.     Mental Status: She is alert and oriented to person, place, and time.      ED Treatments / Results  Labs (all labs ordered are listed, but only abnormal results are displayed) Labs Reviewed  SARS CORONAVIRUS 2 (HOSPITAL ORDER, PERFORMED IN [facility]) - Abnormal; Notable for the following components:      Result Value   SARS Coronavirus 2 POSITIVE (*)    All other components within normal limits  CBC WITH DIFFERENTIAL/PLATELET - Abnormal; Notable for the following components:   WBC 2.9 (*)    RBC 3.39 (*)    Hemoglobin 9.4 (*)    HCT 28.5 (*)    All other components within normal limits  COMPREHENSIVE METABOLIC PANEL - Abnormal; Notable for the following components:   Sodium 130 (*)    CO2 20 (*)    Glucose, Bld 102 (*)    Total Bilirubin 0.2 (*)    All other components within normal limits  D-DIMER, QUANTITATIVE (NOT AT [facility]) - Abnormal; Notable for the following components:   D-Dimer, Quant 0.55 (*)    All other components within normal limits  TRIGLYCERIDES - Abnormal; Notable for the following components:   Triglycerides 205 (*)    All other components within normal limits  FIBRINOGEN - Abnormal; Notable for the following components:   Fibrinogen 598 (*)    All other components within normal limits   C-REACTIVE PROTEIN - Abnormal; Notable for the following components:   CRP 4.6 (*)    All other components within normal limits  GLUCOSE, CAPILLARY - Abnormal; Notable for the following components:   Glucose-Capillary 100 (*)    All other components within normal limits  CULTURE, BLOOD (ROUTINE X 2)  CULTURE, BLOOD (ROUTINE X 2)  LACTIC ACID, PLASMA  LACTIC ACID, PLASMA  PROCALCITONIN  LACTATE DEHYDROGENASE  FERRITIN  TROPONIN I  CBG MONITORING, ED    EKG EKG Interpretation  Date/Time:  Sunday January 02 2019 13:27:22 EDT Ventricular Rate:  70 PR Interval:    QRS Duration: 74 QT Interval:  371 QTC Calculation: 401 R Axis:   2 Text Interpretation:  Sinus rhythm Abnormal R-wave progression, early transition no prior to compare with Confirmed by Meridee Score [phone]) on 01/02/2019 1:48:08 PM   Radiology Dg Chest Port 1 View  Result Date: 01/02/2019 CLINICAL DATA:  69 year old female with history of cough and shortness of breath. EXAM: PORTABLE CHEST 1 VIEW COMPARISON:  No priors. FINDINGS: Patchy areas of interstitial prominence an ill-defined airspace disease in the lungs bilaterally, most evident throughout the mid to lower lungs. No pleural effusions. Pulmonary vasculature does not appear engorged. Heart size appears mildly enlarged. Upper mediastinal contours are within normal limits. IMPRESSION: 1. Spectrum of findings concerning for developing multilobar pneumonia in both lungs, as above. 2. Cardiomegaly, without frank pulmonary venous congestion to indicate to the above findings are related to cardiogenic pulmonary edema. Electronically Signed   By: Trudie Reed M.D.   On: 01/02/2019 14:56    Procedures .Critical Care Performed by: Terrilee Files, MD Authorized by: Terrilee Files, MD   Critical care provider statement:    Critical care time (minutes):  45   Critical care time was exclusive of:  Separately billable procedures and treating other patients    Critical care was necessary to treat or prevent imminent or life-threatening deterioration of  the following conditions:  Respiratory failure   Critical care was time spent personally by me on the following activities:  Discussions with consultants, evaluation of patient's response to treatment, examination of patient, ordering and performing treatments and interventions, ordering and review of laboratory studies, ordering and review of radiographic studies, pulse oximetry, re-evaluation of patient's condition, obtaining history from patient or surrogate, review of old charts and development of treatment plan with patient or surrogate   I assumed direction of critical care for this patient from another provider in my specialty: no     (including critical care time)  Medications Ordered in ED Medications - No data to display   Initial Impression / Assessment and Plan / ED Course  I have reviewed the triage vital signs and the nursing notes.  Pertinent labs & imaging results that were available during my care of the patient were reviewed by me and considered in my medical decision making (see chart for details).  Clinical Course as of Jan 01 1558  Sun Jan 02, 2019  [id number] 68 year old female here with cough and shortness of breath with a positive Covid exposure with her husband.  Differential diagnosis includes Covid, pneumonia, ACS, pneumothorax, metabolic derangement.   [MB]  1529 Chest x-ray positive for multi lobar pneumonia.  Will cover with antibiotics although suspicion high that this is a viral process.   [MB]    Clinical Course User Index [MB] Terrilee Files, MD       Krystal James was evaluated in Emergency Department on 01/03/2019 for the symptoms described in the history of present illness. She was evaluated in the context of the global COVID-19 pandemic, which necessitated consideration that the patient might be at risk for infection with the SARS-CoV-2 virus that causes  COVID-19. Institutional protocols and algorithms that pertain to the evaluation of patients at risk for COVID-19 are in a state of rapid change based on information released by regulatory bodies including the CDC and federal and state organizations. These policies and algorithms were followed during the patient's care in the ED.   Final Clinical Impressions(s) / ED Diagnoses   Final diagnoses:  COVID-19 virus detected  Multifocal pneumonia  Anemia, unspecified type  Hyponatremia    ED Discharge Orders    None       Terrilee Files, MD 01/03/19 1137

## 2019-01-02 NOTE — ED Triage Notes (Signed)
Patient arrived by EMS from home. Pt c/o cough and SOB.  Pts husband was hospitalized for COVID-19 and had positive test result per EMS.   Pt is ambulatory.   BP 131/74, SpO2 96%, HR 80

## 2019-01-03 DIAGNOSIS — I10 Essential (primary) hypertension: Secondary | ICD-10-CM | POA: Diagnosis not present

## 2019-01-03 DIAGNOSIS — J1289 Other viral pneumonia: Secondary | ICD-10-CM | POA: Diagnosis not present

## 2019-01-03 LAB — GLUCOSE, CAPILLARY
Glucose-Capillary: 100 mg/dL — ABNORMAL HIGH (ref 70–99)
Glucose-Capillary: 100 mg/dL — ABNORMAL HIGH (ref 70–99)

## 2019-01-03 MED ORDER — ZINC SULFATE 220 (50 ZN) MG PO CAPS: 220.0000 mg | ORAL_CAPSULE | Freq: Every day | ORAL | Status: AC

## 2019-01-03 MED ORDER — INSULIN ASPART 100 UNIT/ML ~~LOC~~ SOLN: 0.0000 [IU] | Freq: Every day | SUBCUTANEOUS | Status: DC

## 2019-01-03 MED ORDER — ACETAMINOPHEN 325 MG PO TABS: 650.0000 mg | ORAL_TABLET | Freq: Four times a day (QID) | ORAL | Status: AC | PRN

## 2019-01-03 MED ORDER — INSULIN ASPART 100 UNIT/ML ~~LOC~~ SOLN: 0.0000 [IU] | Freq: Three times a day (TID) | SUBCUTANEOUS | Status: DC

## 2019-01-03 MED ORDER — ASCORBIC ACID 500 MG PO TABS: 500.0000 mg | ORAL_TABLET | Freq: Every day | ORAL | Status: AC

## 2019-01-03 NOTE — Care Management Obs Status (Signed)
MEDICARE OBSERVATION STATUS NOTIFICATION   Patient Details  Name: Krystal James MRN: [id number] Date of Birth: 08-11-1951   Medicare Observation Status Notification Given:  Yes(delivered by staff due to COVID 19)    Huston Foley Jacklynn Ganong, RN 01/03/2019, 11:37 AM

## 2019-01-03 NOTE — Care Management CC44 (Signed)
Condition Code 44 Documentation Completed  Patient Details  Name: Krystal James MRN: [id number] Date of Birth: 21-Oct-1950   Condition Code 44 given:  Yes Patient signature on Condition Code 44 notice:  Yes Documentation of 2 MD's agreement:  Yes Code 44 added to claim:  Yes    Durenda Guthrie, RN 01/03/2019, 11:38 AM

## 2019-01-03 NOTE — Care Management (Signed)
Patient was admitted for  pneumonia due to COVID 19 virus. Patient has been ambulatory, no oxygen needs and no therapy needs. Discharging home with plan to followup with primary MD.    Vance Peper, RN BSN Case Manager [phone]

## 2019-01-03 NOTE — Discharge Summary (Signed)
Krystal James, is a 68 y.o. female  DOB [birth year]  MRN [id number].  Admission date:  01/02/2019  Admitting Physician  Lorin Glass, MD  Discharge Date:  01/03/2019   Primary MD  Angelica Chessman, MD  Recommendations for primary care physician for things to follow:  - please check CBC, BMP, LFTs, 2 view chest x-ray during next visit. -Patient was given [facility] Department of Health infection prevention recommendations, and she was instructed to follow.   Admission Diagnosis  Hyponatremia [E87.1] Multifocal pneumonia [J18.9] Anemia, unspecified type [D64.9] COVID-19 virus detected [U07.1]   Discharge Diagnosis  Hyponatremia [E87.1] Multifocal pneumonia [J18.9] Anemia, unspecified type [D64.9] COVID-19 virus detected [U07.1]    Active Problems:   Pneumonia due to COVID-19 virus   Essential hypertension   Type 2 diabetes mellitus with hyperlipidemia (HCC)      Past Medical History:  Diagnosis Date  . Diverticulosis of colon   . GERD (gastroesophageal reflux disease)   . Hiatal hernia   . Hyperlipidemia   . Hypertension   . Nocturia more than twice per night   . OA (osteoarthritis)    right and left knee,  hands  . OA (osteoarthritis) of knee    right and left,    . PONV (postoperative nausea and vomiting)   . Type 2 diabetes mellitus (HCC)    followed by pcp  . Wears glasses     Past Surgical History:  Procedure Laterality Date  . APPENDECTOMY  age 27  . BREAST REDUCTION SURGERY Bilateral 1994  . COLONOSCOPY  2018  . ESOPHAGOGASTRODUODENOSCOPY  2017  . KNEE ARTHROSCOPY Right 2014   dr Charlann Boxer  . SHOULDER OPEN ROTATOR CUFF REPAIR Right 10-21-2001  dr Leslee Home [facility]  . 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 TOTAL KNEE ARTHROPLASTY  Left 01/12/2018   Procedure: LEFT TOTAL KNEE ARTHROPLASTY;  Surgeon: Durene Romans, MD;  Location: [facility];  Service: Orthopedics;  Laterality: Left;  70 mins  . TUBAL LIGATION Bilateral yrs ago       History of present illness and  Hospital Course:     Kindly see H&P for history of present illness and admission details, please review complete Labs, Consult reports and Test reports for all details in brief  HPI  from the history and physical done on the day of admission 01/02/2019  Krystal James is a 68 y.o. female with history of hypertension, type 2 diabetes mellitus hyperlipidemia, GERD, diverticulosis.  Her husband recently discharged from [facility] after hospitalization for COVID-19 infection.  Patient lives at the same house with her husband and might have gotten herself exposed to him.  Patient also feels that she might have actually started having symptoms before him.  Her symptoms are mainly dry cough and shortness of breath on exertion.  No chest pain.  No fever.  No diarrhea or altered  mentation. Today, her cough was particularly worse and she called EMS.  In the ED, patient was afebrile, heart rate in 60s, blood pressure 165/92, at rest patient is breathing comfortably on room air. COVID-19 test positive. Labs showed sodium level low at 130, serum bicarb low at 20, LDH normal at 133, troponin normal, ferritin normal, CRP elevated to 4.6, lactic acid normal at 1.2, procalcitonin normal at <0.1.  WBC count low at 2.9, hemoglobin low at 9.4 which seems to be at baseline.  D-dimer slightly elevated at 0.55, fibrinogen elevated to 598. Chest x-ray showed spectrum of findings concerning for developing multilobar pneumonia in both lungs. Blood culture sent from ED.  Hospital Course   COVID 19 infection/pneumonia -Patient chest x-ray significant for multi lobar pneumonia, this is most like related to COVID-19 infection, and not bacterial pneumonia, so no indication for  antibiotics currently, patient reports significant discomfort upon presentation but reports she is feeling much better today, she remains on room air, she is afebrile, and will be discharged home today, she was encouraged to continue using her incentive spirometry, to continue with prone position at home, negative whole pulse ox closely. -And was admitted yesterday, and it was anticipated she will stay more than 2 midnights given findings of multilobar pneumonia on the x-ray, and her elevated CRP, being her facility, deconditioning, and leukopenia, and there was a concern for deterioration, but this morning reports she is feeling much better, had rapid improvement of her symptoms, so she will be able to be discharged home today.  Hypertension - Continue amlodipine and lisinopril  Diabetes mellitus -Continue metformin.  Blood glucose level 102 on initial blood work.  To minimize exposure of the staff, I would not order for any sliding-scale insulin at this time.  Consider starting sliding scale if blood glucose level on morning blood work is elevated.  Hyperlipidemia/hypertriglyceridemia -Continue Crestor and fenofibrate  Discharge Condition:  Stable   Follow UP  Follow-up Information    Angelica ChessmanAguiar, Rafaela M, MD Follow up in 2 week(s).   Specialty:  Family Medicine Contact information: [address] [place] [id number] [phone]             Discharge Instructions  and  Discharge Medications    Discharge Instructions    Discharge instructions   Complete by:  As directed    Follow with Primary MD Angelica ChessmanAguiar, Rafaela M, MD  Get CBC, CMP, 2 view Chest X ray checked  by Primary MD next visit.    Activity: As tolerated with Full fall precautions use walker/cane & assistance as needed   Disposition Home    Diet: Heart Healthy  , with feeding assistance and aspiration precautions.  For Heart failure patients - Check your Weight same time everyday, if you gain  over 2 pounds, or you develop in leg swelling, experience more shortness of breath or chest pain, call your Primary MD immediately. Follow Cardiac Low Salt Diet and 1.5 lit/day fluid restriction.   On your next visit with your primary care physician please Get Medicines reviewed and adjusted.   Please request your Prim.MD to go over all Hospital Tests and Procedure/Radiological results at the follow up, please get all Hospital records sent to your Prim MD by signing hospital release before you go home.   If you experience worsening of your admission symptoms, develop shortness of breath, life threatening emergency, suicidal or homicidal thoughts you must seek medical attention immediately by calling 911 or calling your MD immediately  if symptoms less severe.  You Must read complete instructions/literature along with all the possible adverse reactions/side effects for all the Medicines you take and that have been prescribed to you. Take any new Medicines after you have completely understood and accpet all the possible adverse reactions/side effects.   Do not drive, operating heavy machinery, perform activities at heights, swimming or participation in water activities or provide baby sitting services if your were admitted for syncope or siezures until you have seen by Primary MD or a Neurologist and advised to do so again.  Do not drive when taking Pain medications.    Do not take more than prescribed Pain, Sleep and Anxiety Medications  Special Instructions: If you have smoked or chewed Tobacco  in the last 2 yrs please stop smoking, stop any regular Alcohol  and or any Recreational drug use.  Wear Seat belts while driving.   Please note  You were cared for by a hospitalist during your hospital stay. If you have any questions about your discharge medications or the care you received while you were in the hospital after you are discharged, you can call the unit and asked to speak with the  hospitalist on call if the hospitalist that took care of you is not available. Once you are discharged, your primary care physician will handle any further medical issues. Please note that NO REFILLS for any discharge medications will be authorized once you are discharged, as it is imperative that you return to your primary care physician (or establish a relationship with a primary care physician if you do not have one) for your aftercare needs so that they can reassess your need for medications and monitor your lab values.   Discharge instructions   Complete by:  As directed    Follow with Primary MD Angelica Chessman, MD in 14 days   Get CBC, CMP, 2 view Chest X ray checked  by Primary MD next visit.    Activity: As tolerated with Full fall precautions use walker/cane & assistance as needed   Disposition Home    Diet: Heart Healthy  , with feeding assistance and aspiration precautions.  For Heart failure patients - Check your Weight same time everyday, if you gain over 2 pounds, or you develop in leg swelling, experience more shortness of breath or chest pain, call your Primary MD immediately. Follow Cardiac Low Salt Diet and 1.5 lit/day fluid restriction.   On your next visit with your primary care physician please Get Medicines reviewed and adjusted.   Please request your Prim.MD to go over all Hospital Tests and Procedure/Radiological results at the follow up, please get all Hospital records sent to your Prim MD by signing hospital release before you go home.   If you experience worsening of your admission symptoms, develop shortness of breath, life threatening emergency, suicidal or homicidal thoughts you must seek medical attention immediately by calling 911 or calling your MD immediately  if symptoms less severe.  You Must read complete instructions/literature along with all the possible adverse reactions/side effects for all the Medicines you take and that have been prescribed to  you. Take any new Medicines after you have completely understood and accpet all the possible adverse reactions/side effects.   Do not drive, operating heavy machinery, perform activities at heights, swimming or participation in water activities or provide baby sitting services if your were admitted for syncope or siezures until you have seen by Primary MD or a Neurologist and advised to  do so again.  Do not drive when taking Pain medications.    Do not take more than prescribed Pain, Sleep and Anxiety Medications  Special Instructions: If you have smoked or chewed Tobacco  in the last 2 yrs please stop smoking, stop any regular Alcohol  and or any Recreational drug use.  Wear Seat belts while driving.   Please note  You were cared for by a hospitalist during your hospital stay. If you have any questions about your discharge medications or the care you received while you were in the hospital after you are discharged, you can call the unit and asked to speak with the hospitalist on call if the hospitalist that took care of you is not available. Once you are discharged, your primary care physician will handle any further medical issues. Please note that NO REFILLS for any discharge medications will be authorized once you are discharged, as it is imperative that you return to your primary care physician (or establish a relationship with a primary care physician if you do not have one) for your aftercare needs so that they can reassess your need for medications and monitor your lab values.   Increase activity slowly   Complete by:  As directed    Increase activity slowly   Complete by:  As directed    MyChart COVID-19 home monitoring program   Complete by:  Jan 03, 2019    Is the patient willing to use a smartphone for remote monitoring via the MyChart app?:  Yes   Temperature monitoring   Complete by:  Jan 03, 2019    After how many days would you like to receive a notification of this  patient's flowsheet entries?:  1     Allergies as of 01/03/2019      Reactions   Sulfa Antibiotics Hives      Medication List    TAKE these medications   acetaminophen 325 MG tablet Commonly known as:  TYLENOL Take 2 tablets (650 mg total) by mouth every 6 (six) hours as needed for mild pain or fever (or Fever >/= 101).   amLODipine 10 MG tablet Commonly known as:  NORVASC Take 10 mg by mouth daily. In the morning.   ascorbic acid 500 MG tablet Commonly known as:  VITAMIN C Take 1 tablet (500 mg total) by mouth daily. Please take for 2 weeks Start taking on:  January 04, 2019   esomeprazole 20 MG capsule Commonly known as:  NEXIUM Take 20 mg by mouth daily before breakfast.   fenofibrate 160 MG tablet Take 160 mg by mouth daily.   gabapentin 300 MG capsule Commonly known as:  NEURONTIN Take 300 mg by mouth 3 (three) times daily.   lisinopril 5 MG tablet Commonly known as:  ZESTRIL Take 5 mg by mouth daily.   metFORMIN 500 MG tablet Commonly known as:  GLUCOPHAGE Take 500 mg by mouth 3 (three) times daily with meals.   rosuvastatin 10 MG tablet Commonly known as:  CRESTOR Take 10 mg by mouth daily with lunch.   zinc sulfate 220 (50 Zn) MG capsule Take 1 capsule (220 mg total) by mouth daily. Please take for 2 weeks Start taking on:  January 04, 2019         Diet and Activity recommendation: See Discharge Instructions above   Consults obtained -  None   Major procedures and Radiology Reports - PLEASE review detailed and final reports for all details, in brief -  Dg Chest Port 1 View  Result Date: 01/02/2019 CLINICAL DATA:  68 year old female with history of cough and shortness of breath. EXAM: PORTABLE CHEST 1 VIEW COMPARISON:  No priors. FINDINGS: Patchy areas of interstitial prominence an ill-defined airspace disease in the lungs bilaterally, most evident throughout the mid to lower lungs. No pleural effusions. Pulmonary vasculature does not appear  engorged. Heart size appears mildly enlarged. Upper mediastinal contours are within normal limits. IMPRESSION: 1. Spectrum of findings concerning for developing multilobar pneumonia in both lungs, as above. 2. Cardiomegaly, without frank pulmonary venous congestion to indicate to the above findings are related to cardiogenic pulmonary edema. Electronically Signed   By: Trudie Reed M.D.   On: 01/02/2019 14:56    Micro Results    Recent Results (from the past 240 hour(s))  SARS Coronavirus 2 [facility] order, Performed in [facility] hospital lab)     Status: Abnormal   Collection Time: 01/02/19  1:27 PM  Result Value Ref Range Status   SARS Coronavirus 2 POSITIVE (A) NEGATIVE Final    Comment: RESULT CALLED TO, READ BACK BY AND VERIFIED WITH: P.DOWD,RN [id number]  BY V.WILKINS (NOTE) If result is NEGATIVE SARS-CoV-2 target nucleic acids are NOT DETECTED. The SARS-CoV-2 RNA is generally detectable in upper and lower  respiratory specimens during the acute phase of infection. The lowest  concentration of SARS-CoV-2 viral copies this assay can detect is 250  copies / mL. A negative result does not preclude SARS-CoV-2 infection  and should not be used as the sole basis for treatment or other  patient management decisions.  A negative result may occur with  improper specimen collection / handling, submission of specimen other  than nasopharyngeal swab, presence of viral mutation(s) within the  areas targeted by this assay, and inadequate number of viral copies  (<250 copies / mL). A negative result must be combined with clinical  observations, patient history, and epidemiological information. If result is POSITIVE SARS-CoV-2 target nucleic acids are DETECTED. T he SARS-CoV-2 RNA is generally detectable in upper and lower  respiratory specimens during the acute phase of infection.  Positive  results are indicative of active infection with SARS-CoV-2.  Clinical  correlation with patient  history and other diagnostic information is  necessary to determine patient infection status.  Positive results do  not rule out bacterial infection or co-infection with other viruses. If result is PRESUMPTIVE POSTIVE SARS-CoV-2 nucleic acids MAY BE PRESENT.   A presumptive positive result was obtained on the submitted specimen  and confirmed on repeat testing.  While 2019 novel coronavirus  (SARS-CoV-2) nucleic acids may be present in the submitted sample  additional confirmatory testing may be necessary for epidemiological  and / or clinical management purposes  to differentiate between  SARS-CoV-2 and other Sarbecovirus currently known to infect humans.  If clinically indicated additional testing with an alternate test  methodology [phone]) is  advised. The SARS-CoV-2 RNA is generally  detectable in upper and lower respiratory specimens during the acute  phase of infection. The expected result is Negative. Fact Sheet for Patients:  BoilerBrush.com.cy Fact Sheet for Healthcare Providers: https://pope.com/ This test is not yet approved or cleared by the [place] FDA and has been authorized for detection and/or diagnosis of SARS-CoV-2 by FDA under an Emergency Use Authorization (EUA).  This EUA will remain in effect (meaning this test can be used) for the duration of the COVID-19 declaration under Section 564(b)(1) of the Act, 21 U.S.C. section 360bbb-3(b)(1), unless the authorization is  terminated or revoked sooner. Performed at [facility], [address], [place], [place] [address]   Blood Culture (routine x 2)     Status: None (Preliminary result)   Collection Time: 01/02/19  1:27 PM  Result Value Ref Range Status   Specimen Description   Final    BLOOD LEFT ANTECUBITAL Performed at [facility], [address], [place], [place] [address]    Special Requests   Final    BOTTLES DRAWN AEROBIC AND  ANAEROBIC Blood Culture adequate volume Performed at [facility], [address] [place], [place] [address]    Culture   Final    NO GROWTH < 12 HOURS Performed at [facility], [address], [place], [place] [address]    Report Status PENDING  Incomplete  Blood Culture (routine x 2)     Status: None (Preliminary result)   Collection Time: 01/02/19  1:32 PM  Result Value Ref Range Status   Specimen Description   Final    BLOOD RIGHT ANTECUBITAL Performed at [facility], [address] [place], [place] [address]    Special Requests   Final    BOTTLES DRAWN AEROBIC AND ANAEROBIC Blood Culture adequate volume Performed at [facility], [address], [place], [place] [address]    Culture   Final    NO GROWTH < 12 HOURS Performed at [facility], [address] [place], [place] [address]    Report Status PENDING  Incomplete       Today   Subjective:   Krystal James today reports she is feeling much better than yesterday, reports her dyspnea has significantly improved, she is afebrile overnight.  Objective:   Blood pressure 115/72, pulse 71, temperature (!) 97.3 F (36.3 C), temperature source Oral, resp. rate (!) 22, height 5' 3.5" (1.613 m), weight 88.4 kg, SpO2 96 %.   Intake/Output Summary (Last 24 hours) at 01/03/2019 1044 Last data filed at 01/03/2019 1000 Gross per 24 hour  Intake 360 ml  Output 202 ml  Net 158 ml    Exam Awake Alert, Oriented x 3, No new F.N deficits, Normal affect Symmetrical Chest wall movement, Good air movement bilaterally, CTAB RRR,No Gallops,Rubs or new Murmurs, No Parasternal Heave +ve B.Sounds, Abd Soft, Non tender, No rebound -guarding or rigidity. No Cyanosis, Clubbing or edema, No new Rash or bruise  Data Review   CBC w Diff:  Lab Results  Component Value Date   WBC 2.9 (L) 01/02/2019   HGB 9.4 (L) 01/02/2019   HCT 28.5 (L) 01/02/2019   PLT 159 01/02/2019    LYMPHOPCT 31 01/02/2019   MONOPCT 8 01/02/2019   EOSPCT 0 01/02/2019   BASOPCT 0 01/02/2019    CMP:  Lab Results  Component Value Date   NA 130 (L) 01/02/2019   K 4.2 01/02/2019   CL 100 01/02/2019   CO2 20 (L) 01/02/2019   BUN 15 01/02/2019   CREATININE 0.72 01/02/2019   PROT 7.1 01/02/2019   ALBUMIN 3.8 01/02/2019   BILITOT 0.2 (L) 01/02/2019   ALKPHOS 66 01/02/2019   AST 28 01/02/2019   ALT 15 01/02/2019  .   Total Time in preparing paper work, data evaluation and todays exam - 35 minutes  Huey Bienenstock M.D on 01/03/2019 at 10:44 AM  [facility]   Office  [phone]

## 2019-01-03 NOTE — Care Management (Signed)
Case manager has spoken with patient concerning Code 44.  Case manager has informed patient that [facility] is beinf arranged to transport her home. CM called [facility] at 1:12pm and scheduled transport within the hour to take patient to her home. Patient's husband is unable to pick her up, he was just discharged from [facility] on Saturday.    Vance Peper, RN Case Manager [phone]

## 2019-01-03 NOTE — Discharge Instructions (Signed)
Person Under Monitoring Name: Krystal James [facility]  Location: [address] [place] [address]   Infection Prevention Recommendations for Individuals Confirmed to have, or Being Evaluated for, 2019 Novel Coronavirus (COVID-19) Infection Who Receive Care at Home  Individuals who are confirmed to have, or are being evaluated for, COVID-19 should follow the prevention steps below until a healthcare provider or local or state health department says they can return to normal activities.  Stay home except to get medical care You should restrict activities outside your home, except for getting medical care. Do not go to work, school, or public areas, and do not use public transportation or taxis.  Call ahead before visiting your doctor Before your medical appointment, call the healthcare provider and tell them that you have, or are being evaluated for, COVID-19 infection. This will help the healthcare providers office take steps to keep other people from getting infected. Ask your healthcare provider to call the local or state health department.  Monitor your symptoms Seek prompt medical attention if your illness is worsening (e.g., difficulty breathing). Before going to your medical appointment, call the healthcare provider and tell them that you have, or are being evaluated for, COVID-19 infection. Ask your healthcare provider to call the local or state health department.  Wear a facemask You should wear a facemask that covers your nose and mouth when you are in the same room with other people and when you visit a healthcare provider. People who live with or visit you should also wear a facemask while they are in the same room with you.  Separate yourself from other people in your home As much as possible, you should stay in a different room from other people in your home. Also, you should use a separate bathroom, if available.  Avoid sharing household items You should not share  dishes, drinking glasses, cups, eating utensils, towels, bedding, or other items with other people in your home. After using these items, you should wash them thoroughly with soap and water.  Cover your coughs and sneezes Cover your mouth and nose with a tissue when you cough or sneeze, or you can cough or sneeze into your sleeve. Throw used tissues in a lined trash can, and immediately wash your hands with soap and water for at least 20 seconds or use an alcohol-based hand rub.  Wash your [employer] your hands often and thoroughly with soap and water for at least 20 seconds. You can use an alcohol-based hand sanitizer if soap and water are not available and if your hands are not visibly dirty. Avoid touching your eyes, nose, and mouth with unwashed hands.   Prevention Steps for Caregivers and Household Members of Individuals Confirmed to have, or Being Evaluated for, COVID-19 Infection Being Cared for in the Home  If you live with, or provide care at home for, a person confirmed to have, or being evaluated for, COVID-19 infection please follow these guidelines to prevent infection:  Follow healthcare providers instructions Make sure that you understand and can help the patient follow any healthcare provider instructions for all care.  Provide for the patients basic needs You should help the patient with basic needs in the home and provide support for getting groceries, prescriptions, and other personal needs.  Monitor the patients symptoms If they are getting sicker, call his or her medical provider and tell them that the patient has, or is being evaluated for, COVID-19 infection. This will help the healthcare providers  office take steps to keep other people from getting infected. Ask the healthcare provider to call the local or state health department.  Limit the number of people who have contact with the patient  If possible, have only one caregiver for the patient.  Other  household members should stay in another home or place of residence. If this is not possible, they should stay  in another room, or be separated from the patient as much as possible. Use a separate bathroom, if available.  Restrict visitors who do not have an essential need to be in the home.  Keep older adults, very young children, and other sick people away from the patient Keep older adults, very young children, and those who have compromised immune systems or chronic health conditions away from the patient. This includes people with chronic heart, lung, or kidney conditions, diabetes, and cancer.  Ensure good ventilation Make sure that shared spaces in the home have good air flow, such as from an air conditioner or an opened window, weather permitting.  Wash your hands often  Wash your hands often and thoroughly with soap and water for at least 20 seconds. You can use an alcohol based hand sanitizer if soap and water are not available and if your hands are not visibly dirty.  Avoid touching your eyes, nose, and mouth with unwashed hands.  Use disposable paper towels to dry your hands. If not available, use dedicated cloth towels and replace them when they become wet.  Wear a facemask and gloves  Wear a disposable facemask at all times in the room and gloves when you touch or have contact with the patients blood, body fluids, and/or secretions or excretions, such as sweat, saliva, sputum, nasal mucus, vomit, urine, or feces.  Ensure the mask fits over your nose and mouth tightly, and do not touch it during use.  Throw out disposable facemasks and gloves after using them. Do not reuse.  Wash your hands immediately after removing your facemask and gloves.  If your personal clothing becomes contaminated, carefully remove clothing and launder. Wash your hands after handling contaminated clothing.  Place all used disposable facemasks, gloves, and other waste in a lined container before  disposing them with other household waste.  Remove gloves and wash your hands immediately after handling these items.  Do not share dishes, glasses, or other household items with the patient  Avoid sharing household items. You should not share dishes, drinking glasses, cups, eating utensils, towels, bedding, or other items with a patient who is confirmed to have, or being evaluated for, COVID-19 infection.  After the person uses these items, you should wash them thoroughly with soap and water.  Wash laundry thoroughly  Immediately remove and wash clothes or bedding that have blood, body fluids, and/or secretions or excretions, such as sweat, saliva, sputum, nasal mucus, vomit, urine, or feces, on them.  Wear gloves when handling laundry from the patient.  Read and follow directions on labels of laundry or clothing items and detergent. In general, wash and dry with the warmest temperatures recommended on the label.  Clean all areas the individual has used often  Clean all touchable surfaces, such as counters, tabletops, doorknobs, bathroom fixtures, toilets, phones, keyboards, tablets, and bedside tables, every day. Also, clean any surfaces that may have blood, body fluids, and/or secretions or excretions on them.  Wear gloves when cleaning surfaces the patient has come in contact with.  Use a diluted bleach solution (e.g., dilute bleach with 1  part bleach and 10 parts water) or a household disinfectant with a label that says EPA-registered for coronaviruses. To make a bleach solution at home, add 1 tablespoon of bleach to 1 quart (4 cups) of water. For a larger supply, add  cup of bleach to 1 gallon (16 cups) of water.  Read labels of cleaning products and follow recommendations provided on product labels. Labels contain instructions for safe and effective use of the cleaning product including precautions you should take when applying the product, such as wearing gloves or eye protection  and making sure you have good ventilation during use of the product.  Remove gloves and wash hands immediately after cleaning.  Monitor yourself for signs and symptoms of illness Caregivers and household members are considered close contacts, should monitor their health, and will be asked to limit movement outside of the home to the extent possible. Follow the monitoring steps for close contacts listed on the symptom monitoring form.   ? If you have additional questions, contact your local health department or call the epidemiologist on call at [phone] (available 24/7). ? This guidance is subject to change. For the most up-to-date guidance from [facility], please refer to their website: YouBlogs.pl

## 2019-01-04 ENCOUNTER — Telehealth (INDEPENDENT_AMBULATORY_CARE_PROVIDER_SITE_OTHER): Payer: Self-pay | Admitting: Family Medicine

## 2019-01-04 NOTE — Telephone Encounter (Signed)
Talked to pt husband, Azucena Kuba. Pt is sleeping right now but starting to feel a little better. He will talk to her about setting up mychart with the link I have texted when she is feeling well enough. He will see if she can get to the MyChart COVID19 Home Monitoring questionnaire. Will f/u on chart in a couple days to check status.

## 2019-01-06 NOTE — Telephone Encounter (Signed)
Spoke with pt regarding MyChart COVID19 Home Monitoring questionnaire. She didn't get the links. I have retexted to her and requested she sign up to complete survey. Pt doesn't feel it is necessary and states she is doing much better.

## 2019-01-07 LAB — CULTURE, BLOOD (ROUTINE X 2)
Culture: NO GROWTH
Culture: NO GROWTH
Special Requests: ADEQUATE
Special Requests: ADEQUATE

## 2019-02-04 ENCOUNTER — Telehealth: Payer: Self-pay | Admitting: *Deleted

## 2019-02-04 NOTE — Telephone Encounter (Signed)
I called pt and she asked if I had called yesterday regarding donating plasma to her husband?  I checked my list and I had called yesterday and talked with Kirsten because her husband was on the phone.   I had given her all the information for her husband so she was familiar with what to do and the web site.    She had not decided if she would donate but she thought her husband was going to.  I thanked her and to tell her husband I said thank you to him also for considering it.

## 2020-09-29 IMAGING — DX PORTABLE CHEST - 1 VIEW
1 series · 1 of 1 positions shown · non-contrast
Comparison: No priors.

CLINICAL DATA: 67-year-old female with history of cough and
shortness of breath.

EXAM:
PORTABLE CHEST 1 VIEW

[chest ap]
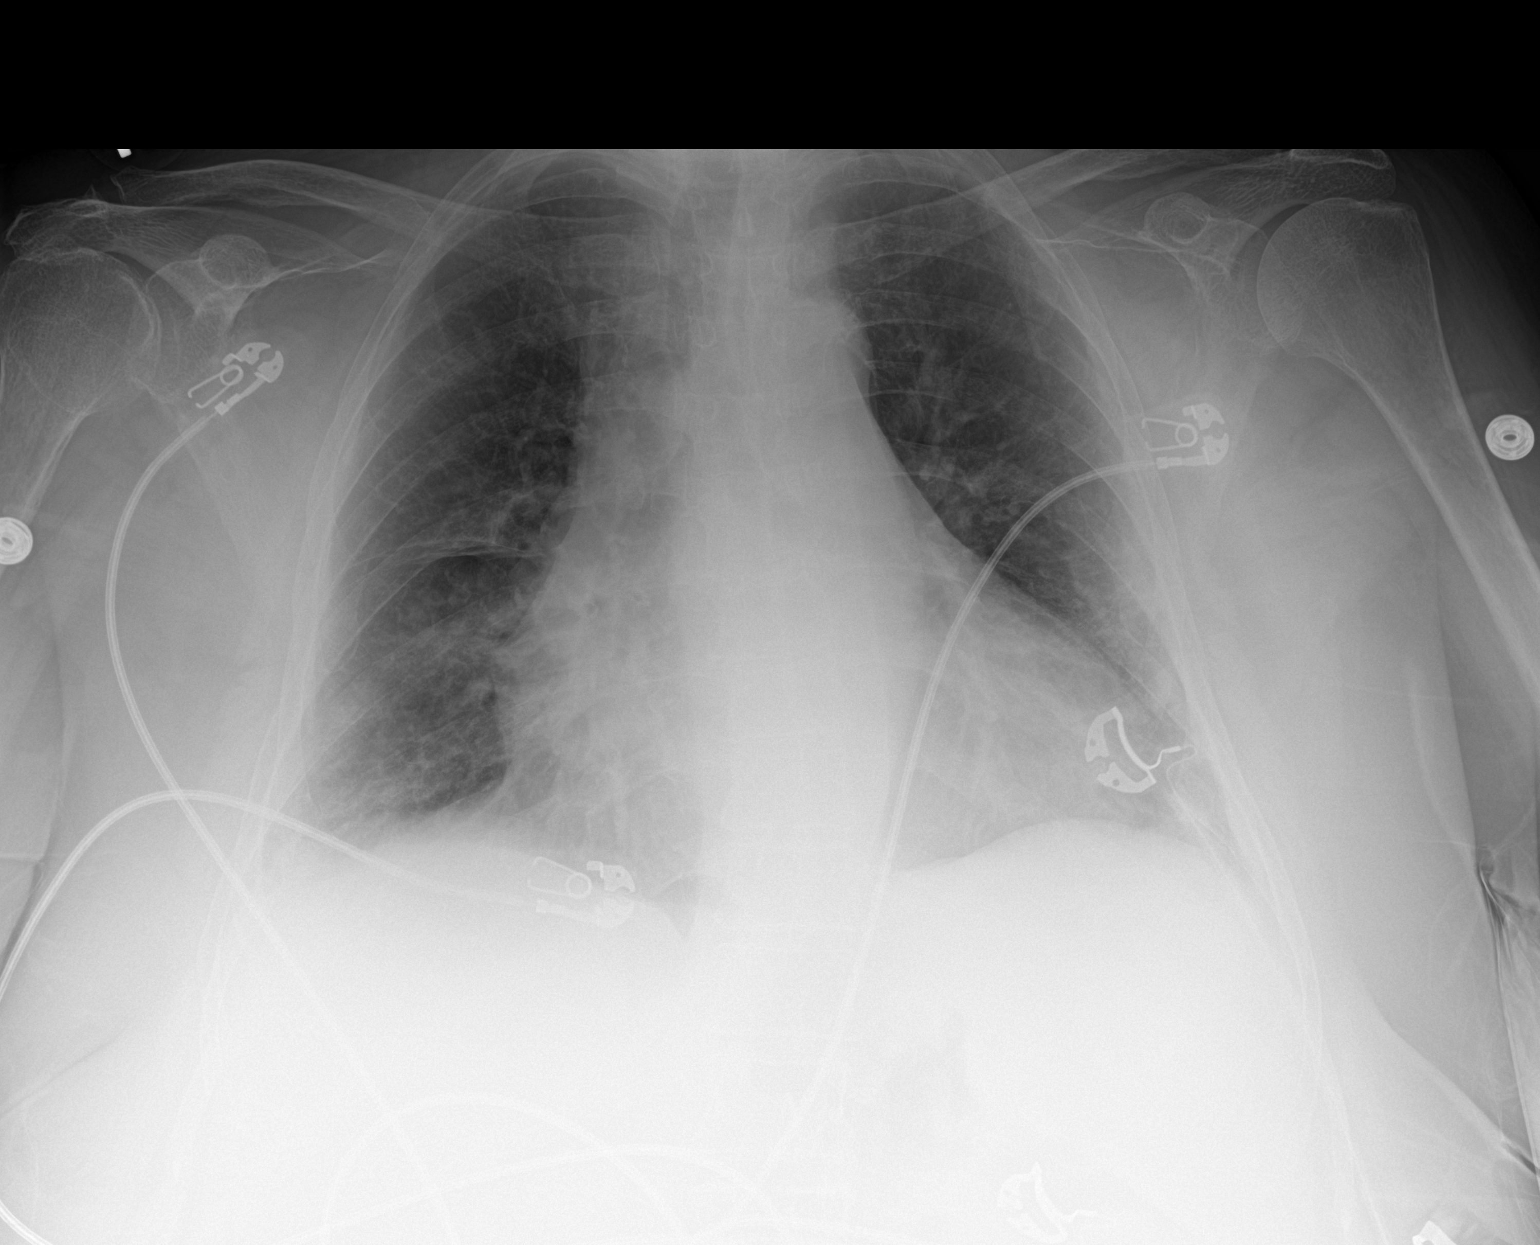

[1 of 1 positions shown; findings below may reference images not displayed]

FINDINGS: Patchy areas of interstitial prominence an ill-defined airspace
disease in the lungs bilaterally, most evident throughout the mid to
lower lungs. No pleural effusions. Pulmonary vasculature does not
appear engorged. Heart size appears mildly enlarged. Upper
mediastinal contours are within normal limits.
IMPRESSION: 1. Spectrum of findings concerning for developing multilobar
pneumonia in both lungs, as above.
2. Cardiomegaly, without frank pulmonary venous congestion to
indicate to the above findings are related to cardiogenic pulmonary
edema.

## 2022-04-02 ENCOUNTER — Emergency Department (HOSPITAL_BASED_OUTPATIENT_CLINIC_OR_DEPARTMENT_OTHER)
Admission: EM | Admit: 2022-04-02 | Discharge: 2022-04-02 | Disposition: A | Discharge status: Home / Self Care | Payer: Medicare HMO | Attending: Emergency Medicine | Admitting: Emergency Medicine

## 2022-04-02 ENCOUNTER — Other Ambulatory Visit (HOSPITAL_BASED_OUTPATIENT_CLINIC_OR_DEPARTMENT_OTHER): Payer: Self-pay

## 2022-04-02 ENCOUNTER — Other Ambulatory Visit: Payer: Self-pay

## 2022-04-02 ENCOUNTER — Encounter (HOSPITAL_BASED_OUTPATIENT_CLINIC_OR_DEPARTMENT_OTHER): Payer: Self-pay | Admitting: Pediatrics

## 2022-04-02 DIAGNOSIS — R42 Dizziness and giddiness: Secondary | ICD-10-CM | POA: Diagnosis not present

## 2022-04-02 DIAGNOSIS — W19XXXA Unspecified fall, initial encounter: Secondary | ICD-10-CM | POA: Insufficient documentation

## 2022-04-02 DIAGNOSIS — Y990 Civilian activity done for income or pay: Secondary | ICD-10-CM | POA: Insufficient documentation

## 2022-04-02 DIAGNOSIS — T887XXA Unspecified adverse effect of drug or medicament, initial encounter: Secondary | ICD-10-CM

## 2022-04-02 DIAGNOSIS — T507X5A Adverse effect of analeptics and opioid receptor antagonists, initial encounter: Secondary | ICD-10-CM | POA: Insufficient documentation

## 2022-04-02 DIAGNOSIS — S4992XA Unspecified injury of left shoulder and upper arm, initial encounter: Secondary | ICD-10-CM | POA: Diagnosis present

## 2022-04-02 DIAGNOSIS — S42302A Unspecified fracture of shaft of humerus, left arm, initial encounter for closed fracture: Secondary | ICD-10-CM | POA: Insufficient documentation

## 2022-04-02 LAB — BASIC METABOLIC PANEL
Anion gap: 8 (ref 5–15)
BUN: 18 mg/dL (ref 8–23)
CO2: 22 mmol/L (ref 22–32)
Calcium: 9.2 mg/dL (ref 8.9–10.3)
Chloride: 102 mmol/L (ref 98–111)
Creatinine, Ser: 0.72 mg/dL (ref 0.44–1.00)
GFR, Estimated: >60 mL/min (ref >60)
Glucose, Bld: 151 mg/dL — ABNORMAL HIGH (ref 70–99)
Potassium: 4.5 mmol/L (ref 3.5–5.1)
Sodium: 132 mmol/L — ABNORMAL LOW (ref 135–145)

## 2022-04-02 LAB — CBC
HCT: 29.8 % — ABNORMAL LOW (ref 36.0–46.0)
Hemoglobin: 9.7 g/dL — ABNORMAL LOW (ref 12.0–15.0)
MCH: 26.6 pg (ref 26.0–34.0)
MCHC: 32.6 g/dL (ref 30.0–36.0)
MCV: 81.9 fL (ref 80.0–100.0)
Platelets: 156 10*3/uL (ref 150–400)
RBC: 3.64 MIL/uL — ABNORMAL LOW (ref 3.87–5.11)
RDW: 14.3 % (ref 11.5–15.5)
WBC: 5 10*3/uL (ref 4.0–10.5)
nRBC: 0 % (ref 0.0–0.2)

## 2022-04-02 MED ORDER — HYDROMORPHONE HCL 1 MG/ML IJ SOLN
1.0000 mg | Freq: Once | INTRAMUSCULAR | Status: AC
  Administered 2022-04-02: 1 mg via INTRAVENOUS
  Filled 2022-04-02: qty 1

## 2022-04-02 MED ORDER — ONDANSETRON HCL 4 MG/2ML IJ SOLN
4.0000 mg | Freq: Once | INTRAMUSCULAR | Status: AC
  Administered 2022-04-02: 4 mg via INTRAVENOUS
  Filled 2022-04-02: qty 2

## 2022-04-02 MED ORDER — OXYCODONE-ACETAMINOPHEN 5-325 MG PO TABS
1.0000 | ORAL_TABLET | Freq: Four times a day (QID) | ORAL | 0 refills | Status: AC | PRN
  Filled 2022-04-02: qty 15, 2d supply, fill #0

## 2022-04-02 NOTE — ED Notes (Signed)
Patient c/o light headedness x this AM. States she feels sick and like she is going to pass out. Patient also informed this RN that she took one of her husbands pain medications this AM. A narcotic. Patient's arm is in a splint and sling. +PSCM noted on arrival to room. Patient states the whole arm is aching. Patient medicated for pain.

## 2022-04-02 NOTE — Discharge Instructions (Addendum)
Prescription for opiates sent to our pharmacy. Follow-up with orthopedics as previously directed

## 2022-04-02 NOTE — ED Triage Notes (Signed)
Reported fall yesterday at work and sustained a broken arm; stated was given hydromorphone last night and took Tramadol 50 mg this morning at 5am; c/o dizziness that started at 0600; c/o ongoing arm pain; pending orthopaedic surgery this coming Friday

## 2022-04-02 NOTE — ED Provider Notes (Addendum)
[facility] EMERGENCY DEPARTMENT Provider Note   CSN: [id number] Arrival date & time: 04/02/22  5102     History  Chief Complaint  Patient presents with   Dizziness    Krystal James is a 71 y.o. female.   Dizziness   71 year old female with medical history significant for recent fall at work and fractured humerus who presents to the emergency department with lightheadedness this morning.  The patient states that she was unable to pick up oxycodone which had been prescribed to her because the pharmacy had been prescribed to was out.  She had taken tramadol this morning and complained of lightheadedness following taking the tramadol.  She presents primarily due to ongoing left arm pain in this setting of inability to get outpatient narcotics with some lightheadedness noted.  Symptoms have since resolved.  She denies any chest pain or shortness of breath.  She endorses continued aching and sharp pain to the left arm.  The patient's arm is in a splint which was done in an outside ER.  The patient has scheduled follow-up with orthopedics.  Home Medications Prior to Admission medications   Medication Sig Start Date End Date Taking? Authorizing Provider  oxyCODONE-acetaminophen (PERCOCET/ROXICET) 5-325 MG tablet Take 1-2 tablets by mouth every 6 (six) hours as needed for severe pain. 04/02/22  Yes Ernie Avena, MD  acetaminophen (TYLENOL) 325 MG tablet Take 2 tablets (650 mg total) by mouth every 6 (six) hours as needed for mild pain or fever (or Fever >/= 101). 01/03/19   Elgergawy, Leana Roe, MD  amLODipine (NORVASC) 10 MG tablet Take 10 mg by mouth daily. In the morning.    [provider]  esomeprazole (NEXIUM) 20 MG capsule Take 20 mg by mouth daily before breakfast.    [provider]  fenofibrate 160 MG tablet Take 160 mg by mouth daily.    [provider]  gabapentin (NEURONTIN) 300 MG capsule Take 300 mg by mouth 3 (three) times daily. 12/31/18    [provider]  lisinopril (PRINIVIL,ZESTRIL) 5 MG tablet Take 5 mg by mouth daily.    [provider]  metFORMIN (GLUCOPHAGE) 500 MG tablet Take 500 mg by mouth 3 (three) times daily with meals.    [provider]  rosuvastatin (CRESTOR) 10 MG tablet Take 10 mg by mouth daily with lunch.    [provider]  vitamin C (VITAMIN C) 500 MG tablet Take 1 tablet (500 mg total) by mouth daily. Please take for 2 weeks 01/04/19   Elgergawy, Leana Roe, MD  zinc sulfate 220 (50 Zn) MG capsule Take 1 capsule (220 mg total) by mouth daily. Please take for 2 weeks 01/04/19   Elgergawy, Leana Roe, MD      Allergies    Sulfa antibiotics    Review of Systems   Review of Systems  Musculoskeletal:  Positive for myalgias.  Neurological:  Positive for light-headedness.  All other systems reviewed and are negative.   Physical Exam Updated Vital Signs BP 129/69   Pulse 62   Temp 97.6 F (36.4 C) (Oral)   Resp 16   Ht 5\' 3"  (1.6 m)   Wt 93.9 kg   SpO2 93%   BMI 36.67 kg/m  Physical Exam Vitals and nursing note reviewed.  Constitutional:      General: She is not in acute distress. HENT:     Head: Normocephalic and atraumatic.  Eyes:     Conjunctiva/sclera: Conjunctivae normal.     Pupils:  Pupils are equal, round, and reactive to light.  Cardiovascular:     Rate and Rhythm: Normal rate and regular rhythm.  Pulmonary:     Effort: Pulmonary effort is normal. No respiratory distress.     Breath sounds: Normal breath sounds.  Abdominal:     General: There is no distension.     Tenderness: There is no abdominal tenderness. There is no guarding.  Musculoskeletal:        General: No deformity or signs of injury.     Cervical back: Neck supple.     Comments: Left upper extremity in a sling, status post splinting and neurovascularly intact with 2+ radial pulses and intact motor function along the median, ulnar, radial nerve distributions.  Skin:    Findings: No  lesion or rash.  Neurological:     General: No focal deficit present.     Mental Status: She is alert. Mental status is at baseline.     ED Results / Procedures / Treatments   Labs (all labs ordered are listed, but only abnormal results are displayed) Labs Reviewed  BASIC METABOLIC PANEL - Abnormal; Notable for the following components:      Result Value   Sodium 132 (*)    Glucose, Bld 151 (*)    All other components within normal limits  CBC - Abnormal; Notable for the following components:   RBC 3.64 (*)    Hemoglobin 9.7 (*)    HCT 29.8 (*)    All other components within normal limits  CBG MONITORING, ED    EKG EKG Interpretation  Date/Time:  Wednesday April 02 2022 10:20:48 EDT Ventricular Rate:  65 PR Interval:  164 QRS Duration: 70 QT Interval:  380 QTC Calculation: 395 R Axis:   15 Text Interpretation: Normal sinus rhythm Normal ECG When compared with ECG of 02-Jan-2019 13:27, PREVIOUS ECG IS PRESENT Confirmed by Ernie Avena ([id number]) on 04/02/2022 10:26:48 AM  Radiology No results found.  Procedures Procedures    Medications Ordered in ED Medications  HYDROmorphone (DILAUDID) injection 1 mg (1 mg Intravenous Given 04/02/22 1157)  ondansetron (ZOFRAN) injection 4 mg (4 mg Intravenous Given 04/02/22 1155)    ED Course/ Medical Decision Making/ A&P                           Medical Decision Making Amount and/or Complexity of Data Reviewed Labs: ordered.  Risk Prescription drug management.    71 year old female with medical history significant for recent fall at work and fractured humerus who presents to the emergency department with lightheadedness this morning.  The patient states that she was unable to pick up oxycodone which had been prescribed to her because the pharmacy had been prescribed to was out.  She had taken tramadol this morning and complained of lightheadedness following taking the tramadol.  She presents primarily due to ongoing left arm  pain in this setting of inability to get outpatient narcotics with some lightheadedness noted.  Symptoms have since resolved.  She denies any chest pain or shortness of breath.  She endorses continued aching and sharp pain to the left arm.  The patient's arm is in a splint which was done in an outside ER.  The patient has scheduled follow-up with orthopedics.  On arrival, the patient was vitally stable.  She presents with a physical exam significant for left upper extremity status post splinting, neurovascular intact with 2+ radial pulses and intact motor function in the  left upper extremity.  Intact sensation to light touch.  Patient presenting with difficulty with pain control in the setting of inability to get an outpatient opiate.   She taken some home tramadol with no relief.  She did experience some lightheadedness earlier today which has since resolved.  Suspect likely medication side effect as etiology of her lightheadedness.  Screening laboratory work-up performed significant for CBC without a leukocytosis, hemoglobin stable at 9.7, BMP without significant electrolyte abnormality, normal CBG with mild elevation to 151 with no hypoglycemia present.  An EKG was performed which revealed no abnormal intervals.  The patient was administered IV Dilaudid and Zofran for pain control and nausea.  Neurovascularly intact, no new trauma.  Patient has outpatient follow-up scheduled.  A prescription for Percocet was sent to her pharmacy and she was advised follow-up with orthopedics.  Final Clinical Impression(s) / ED Diagnoses Final diagnoses:  Closed fracture of shaft of left humerus, unspecified fracture morphology, initial encounter  Lightheadedness  Medication side effect    Rx / DC Orders ED Discharge Orders          Ordered    oxyCODONE-acetaminophen (PERCOCET/ROXICET) 5-325 MG tablet  Every 6 hours PRN        04/02/22 1152              Ernie Avena, MD 04/02/22 Serena Croissant    Ernie Avena, MD 04/02/22 1929

## 2022-04-10 ENCOUNTER — Other Ambulatory Visit (HOSPITAL_BASED_OUTPATIENT_CLINIC_OR_DEPARTMENT_OTHER): Payer: Self-pay

## 2022-04-17 ENCOUNTER — Ambulatory Visit: Payer: Medicare HMO | Attending: Physician Assistant | Admitting: Physical Therapy

## 2022-04-17 DIAGNOSIS — M25632 Stiffness of left wrist, not elsewhere classified: Secondary | ICD-10-CM

## 2022-04-17 DIAGNOSIS — M79622 Pain in left upper arm: Secondary | ICD-10-CM

## 2022-04-17 DIAGNOSIS — M25622 Stiffness of left elbow, not elsewhere classified: Secondary | ICD-10-CM | POA: Diagnosis present

## 2022-04-17 DIAGNOSIS — M6281 Muscle weakness (generalized): Secondary | ICD-10-CM

## 2022-04-17 DIAGNOSIS — R600 Localized edema: Secondary | ICD-10-CM | POA: Diagnosis present

## 2022-04-17 DIAGNOSIS — M25612 Stiffness of left shoulder, not elsewhere classified: Secondary | ICD-10-CM

## 2022-04-17 NOTE — Therapy (Signed)
OUTPATIENT PHYSICAL THERAPY SHOULDER EVALUATION   Patient Name: Krystal James MRN: [id number] DOB:[birth year], 71 y.o., female Today's Date: 04/17/2022   PT End of Session - 04/17/22 1216     Visit Number 1    Number of Visits 20    Date for PT Re-Evaluation 06/26/22    Authorization Type [employer] MCR    Progress Note Due on Visit 10    PT Start Time 1105    PT Stop Time 1145    PT Time Calculation (min) 40 min    Activity Tolerance Patient tolerated treatment well    Behavior During Therapy WFL for tasks assessed/performed             Past Medical History:  Diagnosis Date   Diverticulosis of colon    GERD (gastroesophageal reflux disease)    Hiatal hernia    Hyperlipidemia    Hypertension    Nocturia more than twice per night    OA (osteoarthritis)    right and left knee,  hands   OA (osteoarthritis) of knee    right and left,     PONV (postoperative nausea and vomiting)    Type 2 diabetes mellitus (HCC)    followed by pcp   Wears glasses    Past Surgical History:  Procedure Laterality Date   APPENDECTOMY  age 1   BREAST REDUCTION SURGERY Bilateral 1994   COLONOSCOPY  2018   ESOPHAGOGASTRODUODENOSCOPY  2017   KNEE ARTHROSCOPY Right 2014   dr Charlann Boxer   SHOULDER OPEN ROTATOR CUFF REPAIR Right 10-21-2001  dr Leslee Home [facility]   TOTAL KNEE ARTHROPLASTY Right 12/07/2017   Procedure: RIGHT TOTAL KNEE ARTHROPLASTY;  Surgeon: Durene Romans, MD;  Location: [facility];  Service: Orthopedics;  Laterality: Right;  90 mins   TOTAL KNEE ARTHROPLASTY Left 01/12/2018   Procedure: LEFT TOTAL KNEE ARTHROPLASTY;  Surgeon: Durene Romans, MD;  Location: [facility];  Service: Orthopedics;  Laterality: Left;  70 mins   TUBAL LIGATION Bilateral yrs ago   Patient Active Problem List   Diagnosis Date Noted   Pneumonia due to COVID-19 virus 01/02/2019   Essential hypertension 01/02/2019   Type 2 diabetes mellitus with hyperlipidemia (HCC) 01/02/2019   Obese 01/13/2018   S/P right TKA 12/07/2017    S/P left TKA 12/07/2017   Gastroesophageal reflux disease without esophagitis 03/24/2014   Hyperlipidemia associated with type 2 diabetes mellitus (HCC) 03/24/2014   Hypertriglyceridemia 03/24/2014    PCP: Angelica Chessman, MD  REFERRING PROVIDER: Dorian Heckle, PA-C  REFERRING DIAG: Aftercare following surgery of the musculoskeletal system    THERAPY DIAG:  Stiffness of left shoulder, not elsewhere classified  Stiffness of left elbow, not elsewhere classified  Stiffness of left wrist, not elsewhere classified  Muscle weakness (generalized)  Pain in left upper arm  Localized edema  Rationale for Evaluation and Treatment Rehabilitation  ONSET DATE: Injured 04/01/22, ORIF 04/07/2022  SUBJECTIVE:  SUBJECTIVE STATEMENT: Patient reports that she was in office and caught her foot falling, turned to try to avoid hitting her head and landed on her shoulder.  She was taken to ED on a Tuesday, saw orthopedist on Friday, and had surgery on Monday 04/07/22.  On follow-up told healing well, next week gets staples out, then after 3 weeks will return visit for more imaging to check progress.  She reports pain is very well controlled and minimal.   PERTINENT HISTORY: From MD notes on 04/15/2022: Krystal James is a 71 y.o. year old female who presents today approximately 1 week following plate and screw fixation left midshaft humerus fracture. Patient states overall she is been doing well. She is had some swelling in her arm but has been working on motion of her elbow. She stopped wearing her sling about 3-4 days ago to work on motion of her arm throughout the day. She has been conservative with taking her pain medication as it does not make her feel well. No numbness or tingling. I placed an order for PT to work on  range of motion and active assist motion of her shoulder as well as her elbow. She will follow up next Wednesday or Thursday with the cast tech in our office for staple removal and Steri-Strips placement. She may continue to shower and pat the incision dry.  PMH: R RTC repair, T2DM, HTN, bil TKA, GERD, L CTS  PAIN:  Are you having pain? Yes: NPRS scale: 1/10 Pain location: L shoulder  Pain description: ache Aggravating factors: moving shoulder Relieving factors: has not needed medication  PRECAUTIONS: Shoulder  NWB LUE  WEIGHT BEARING RESTRICTIONS Yes NWB LUE x 6 weeks post-op (to 03/18/2022)  FALLS:  Has patient fallen in last 6 months? Yes. Number of falls 1 with injury  LIVING ENVIRONMENT: Lives with: lives with their spouse Lives in: House/apartment Stairs: Yes: External: 3 steps; can reach both Has following equipment at home: None  OCCUPATION: Works full time in office job, accounts payable.  Mostly sedentary.   PLOF: Independent  PATIENT GOALS get full use of left arm again.   OBJECTIVE:   DIAGNOSTIC FINDINGS:  04/15/2022 L humerus XR Obtained AP and lateral views of the left humerus.  Plate and screw fixation left midshaft humeral fracture maintaining great position.    Staples retained in the soft tissue.  No other findings.  PATIENT SURVEYS:  Quick Dash 63.6% impairment  COGNITION:  Overall cognitive status: Within functional limits for tasks assessed     OBSERVATION: Incision L upper arm with staples, no signs/symptoms infection.  Patient with no apparent distress.    EDEMA Increased edema in distal LUE especially wrist and hand. 17.5 cm R wrist, 20.5 cm L wrist.   SENSATION: WFL  POSTURE: Forward head, rounded shoulders  UPPER EXTREMITY ROM:   Active ROM Right AROM eval Left AROM eval  Shoulder flexion 145 72 (P ~120)  Shoulder extension 70 52   Shoulder abduction 125 48 (P ~80)  Shoulder adduction    Shoulder internal rotation  (P ~45)   Shoulder external rotation  (P ~ 45)  Elbow flexion 125 110  Elbow extension 0 Lacking 15  Wrist flexion 50 35  Wrist extension 15 0  Wrist ulnar deviation    Wrist radial deviation    Wrist pronation  70  Wrist supination  65  (Blank rows = not tested)  UPPER EXTREMITY MMT:  LUE not formally MMT today due to post-op precautions.  MMT Right eval Left eval  Shoulder flexion 4+ 2+  Shoulder extension 4+ 2+  Shoulder abduction 4+ 2+  Shoulder adduction    Shoulder internal rotation 4+ NT  Shoulder external rotation 4+ NT  Elbow flexion 5   Elbow extension 5   Wrist flexion 5   Wrist extension 5   Wrist ulnar deviation    Wrist radial deviation    Wrist pronation    Wrist supination    Grip strength  fair fair  (Blank rows = not tested)  SHOULDER SPECIAL TESTS:  NT  JOINT MOBILITY TESTING:  NT  PALPATION:  NA   TODAY'S TREATMENT:  04/17/2022 Therapeutic Exercise: to improve strength and mobility.  Demo, verbal and tactile cues throughout for technique. - Putty Squeezes with YT putty - Finger Pinch and Pull with Putty with YT putty - Flexion-Extension Shoulder Pendulum with Table Support  -10 reps - Seated Scapular Retraction  10 reps   PATIENT EDUCATION: Education details: findings, POC, initial HEP, issued Yellow theraputty.  Educated on performing exercises gently, should be pain free at all times.   Person educated: Patient Education method: Consulting civil engineer, Demonstration, Verbal cues, and Handouts Education comprehension: verbalized understanding and returned demonstration   HOME EXERCISE PROGRAM: Access Code: [email or web address] - Putty Squeezes  - 3 x daily - 7 x weekly - 1 sets - 10 reps - Finger Pinch and Pull with Putty  - 3 x daily - 7 x weekly - 1 sets - 10 reps - Flexion-Extension Shoulder Pendulum with Table Support  - 3 x daily - 7 x weekly - 10 reps - Seated Scapular Retraction  - 3 x daily - 7 x weekly - 10 reps  ASSESSMENT:  CLINICAL  IMPRESSION: Patient is a 71 y.o. right hand dominant female who was seen today for physical therapy evaluation and treatment for L humerus fracture and ORIF following fall on 04/01/22. She demonstrates decreased L shoulder and arm strength, ROM, and significant edema in L wrist and hand.  Today given yellow theraputty to start gently activating forearm muscles to decrease edema.  Also educated on pendulums and scapular squeezes.  She had  no pain or discomfort with exercises, but educated throughout that goal was to gently activate these muscles without any pain.  She would benefit from skilled physical therapy to improve LUE strength, ROM to allow return to PLOF.    OBJECTIVE IMPAIRMENTS decreased activity tolerance, decreased endurance, decreased ROM, decreased strength, increased edema, increased fascial restrictions, impaired perceived functional ability, increased muscle spasms, impaired flexibility, impaired UE functional use, postural dysfunction, and pain.   ACTIVITY LIMITATIONS carrying, lifting, bending, transfers, bed mobility, bathing, dressing, self feeding, reach over head, and hygiene/grooming  PARTICIPATION LIMITATIONS: meal prep, cleaning, laundry, interpersonal relationship, driving, shopping, community activity, and occupation  PERSONAL FACTORS 3+ comorbidities: history R RTC repair, carpel tunnel syndrome, OA wrists, T2DM  are also affecting patient's functional outcome.   REHAB POTENTIAL: Good  CLINICAL DECISION MAKING: Stable/uncomplicated  EVALUATION COMPLEXITY: Low   GOALS: Goals reviewed with patient? Yes  SHORT TERM GOALS: Target date: 05/01/2022   Patient will be independent with initial HEP.  Baseline: given Goal status: INITIAL  2.  Patient will demonstrate improved L wrist/hand edema  Baseline: significant edema in wrist/hand limiting wrist ROM Goal status: INITIAL   LONG TERM GOALS: Target date: 06/26/2022   Patient will be independent with  advanced/ongoing HEP to improve outcomes and carryover.  Baseline: needs progression Goal status: INITIAL  2.  Patient will  demonstrate L wrist AROM = R wrist AROM to perform ADLs.   Baseline: see objective Goal status: INITIAL  3.  Patient will demonstrate full L elbow ROM to perform ADLs.  Baseline: see objective Goal status: INITIAL  4.  Patient to improve L shoulder AROM to [facility] without pain provocation to allow for increased ease of ADLs.  Baseline: see objective Goal status: INITIAL  5.  Patient will demonstrate improved functional UE strength as demonstrated by 4+/5 shoulder strength. Baseline: see objective Goal status: INITIAL  6  Patient will report <50% impairment on QuickDash to demonstrate improved functional ability.  Baseline: 63.6% Goal status: INITIAL  PLAN: PT FREQUENCY: 1-2x/week  PT DURATION: 10 weeks  PLANNED INTERVENTIONS: Therapeutic exercises, Therapeutic activity, Neuromuscular re-education, Balance training, Gait training, Patient/Family education, Self Care, Joint mobilization, Dry Needling, Spinal mobilization, Cryotherapy, Moist heat, scar mobilization, Vasopneumatic device, Ultrasound, Manual therapy, and Re-evaluation  PLAN FOR NEXT SESSION:  Review HEP, progress full ROM for Left elbow, forearm and hand.  AAROM L shoulder to tolerance.  NWB L UE, no resistance or lifting.  Modalities PRN.     Jena Gauss, PT, DPT  04/17/2022, 12:22 PM

## 2022-04-23 ENCOUNTER — Ambulatory Visit: Payer: Medicare HMO

## 2022-04-23 DIAGNOSIS — M6281 Muscle weakness (generalized): Secondary | ICD-10-CM

## 2022-04-23 DIAGNOSIS — M25612 Stiffness of left shoulder, not elsewhere classified: Secondary | ICD-10-CM

## 2022-04-23 DIAGNOSIS — M79622 Pain in left upper arm: Secondary | ICD-10-CM

## 2022-04-23 DIAGNOSIS — R600 Localized edema: Secondary | ICD-10-CM

## 2022-04-23 DIAGNOSIS — M25632 Stiffness of left wrist, not elsewhere classified: Secondary | ICD-10-CM

## 2022-04-23 DIAGNOSIS — M25622 Stiffness of left elbow, not elsewhere classified: Secondary | ICD-10-CM

## 2022-04-23 NOTE — Therapy (Signed)
OUTPATIENT PHYSICAL THERAPY TREATMENT   Patient Name: Krystal James MRN: [id number] DOB:Dec 07, 1950, 71 y.o., female Today's Date: 04/23/2022   PT End of Session - 04/23/22 1754     Visit Number 2    Number of Visits 20    Date for PT Re-Evaluation 06/26/22    Authorization Type [employer] MCR    Progress Note Due on Visit 10    PT Start Time 1703    PT Stop Time 1745    PT Time Calculation (min) 42 min    Activity Tolerance Patient tolerated treatment well    Behavior During Therapy WFL for tasks assessed/performed              Past Medical History:  Diagnosis Date   Diverticulosis of colon    GERD (gastroesophageal reflux disease)    Hiatal hernia    Hyperlipidemia    Hypertension    Nocturia more than twice per night    OA (osteoarthritis)    right and left knee,  hands   OA (osteoarthritis) of knee    right and left,     PONV (postoperative nausea and vomiting)    Type 2 diabetes mellitus (HCC)    followed by pcp   Wears glasses    Past Surgical History:  Procedure Laterality Date   APPENDECTOMY  age 35   BREAST REDUCTION SURGERY Bilateral 1994   COLONOSCOPY  2018   ESOPHAGOGASTRODUODENOSCOPY  2017   KNEE ARTHROSCOPY Right 2014   dr Charlann Boxer   SHOULDER OPEN ROTATOR CUFF REPAIR Right 10-21-2001  dr Leslee Home [facility]   TOTAL KNEE ARTHROPLASTY Right 12/07/2017   Procedure: RIGHT TOTAL KNEE ARTHROPLASTY;  Surgeon: Durene Romans, MD;  Location: [facility];  Service: Orthopedics;  Laterality: Right;  90 mins   TOTAL KNEE ARTHROPLASTY Left 01/12/2018   Procedure: LEFT TOTAL KNEE ARTHROPLASTY;  Surgeon: Durene Romans, MD;  Location: [facility];  Service: Orthopedics;  Laterality: Left;  70 mins   TUBAL LIGATION Bilateral yrs ago   Patient Active Problem List   Diagnosis Date Noted   Pneumonia due to COVID-19 virus 01/02/2019   Essential hypertension 01/02/2019   Type 2 diabetes mellitus with hyperlipidemia (HCC) 01/02/2019   Obese 01/13/2018   S/P right TKA 12/07/2017   S/P  left TKA 12/07/2017   Gastroesophageal reflux disease without esophagitis 03/24/2014   Hyperlipidemia associated with type 2 diabetes mellitus (HCC) 03/24/2014   Hypertriglyceridemia 03/24/2014    PCP: Angelica Chessman, MD  REFERRING PROVIDER: Dorian Heckle, PA-C  REFERRING DIAG: Aftercare following surgery of the musculoskeletal system    THERAPY DIAG:  Stiffness of left shoulder, not elsewhere classified  Stiffness of left elbow, not elsewhere classified  Stiffness of left wrist, not elsewhere classified  Muscle weakness (generalized)  Pain in left upper arm  Localized edema  Rationale for Evaluation and Treatment Rehabilitation  ONSET DATE: Injured 04/01/22, ORIF 04/07/2022  SUBJECTIVE:  SUBJECTIVE STATEMENT: Pt reports no pain   PERTINENT HISTORY: From MD notes on 04/15/2022: Tanicia Weatherwax is a 71 y.o. year old female who presents today approximately 1 week following plate and screw fixation left midshaft humerus fracture. Patient states overall she is been doing well. She is had some swelling in her arm but has been working on motion of her elbow. She stopped wearing her sling about 3-4 days ago to work on motion of her arm throughout the day. She has been conservative with taking her pain medication as it does not make her feel well. No numbness or tingling. I placed an order for PT to work on range of motion and active assist motion of her shoulder as well as her elbow. She will follow up next Wednesday or Thursday with the cast tech in our office for staple removal and Steri-Strips placement. She may continue to shower and pat the incision dry.  PMH: R RTC repair, T2DM, HTN, bil TKA, GERD, L CTS  PAIN:  Are you having pain? Yes: NPRS scale: 1/10 Pain location: L shoulder  Pain  description: ache Aggravating factors: moving shoulder Relieving factors: has not needed medication  PRECAUTIONS: Shoulder  NWB LUE  WEIGHT BEARING RESTRICTIONS Yes NWB LUE x 6 weeks post-op (to 03/18/2022)  FALLS:  Has patient fallen in last 6 months? Yes. Number of falls 1 with injury  LIVING ENVIRONMENT: Lives with: lives with their spouse Lives in: House/apartment Stairs: Yes: External: 3 steps; can reach both Has following equipment at home: None  OCCUPATION: Works full time in office job, accounts payable.  Mostly sedentary.   PLOF: Independent  PATIENT GOALS get full use of left arm again.   OBJECTIVE:   DIAGNOSTIC FINDINGS:  04/15/2022 L humerus XR Obtained AP and lateral views of the left humerus.  Plate and screw fixation left midshaft humeral fracture maintaining great position.    Staples retained in the soft tissue.  No other findings.  PATIENT SURVEYS:  Quick Dash 63.6% impairment  COGNITION:  Overall cognitive status: Within functional limits for tasks assessed     OBSERVATION: Incision L upper arm with staples, no signs/symptoms infection.  Patient with no apparent distress.    EDEMA Increased edema in distal LUE especially wrist and hand. 17.5 cm R wrist, 20.5 cm L wrist.   SENSATION: WFL  POSTURE: Forward head, rounded shoulders  UPPER EXTREMITY ROM:   Active ROM Right AROM eval Left AROM eval  Shoulder flexion 145 72 (P ~120)  Shoulder extension 70 52   Shoulder abduction 125 48 (P ~80)  Shoulder adduction    Shoulder internal rotation  (P ~45)  Shoulder external rotation  (P ~ 45)  Elbow flexion 125 110  Elbow extension 0 Lacking 15  Wrist flexion 50 35  Wrist extension 15 0  Wrist ulnar deviation    Wrist radial deviation    Wrist pronation  70  Wrist supination  65  (Blank rows = not tested)  UPPER EXTREMITY MMT:  LUE not formally MMT today due to post-op precautions.   MMT Right eval Left eval  Shoulder flexion 4+ 2+   Shoulder extension 4+ 2+  Shoulder abduction 4+ 2+  Shoulder adduction    Shoulder internal rotation 4+ NT  Shoulder external rotation 4+ NT  Elbow flexion 5   Elbow extension 5   Wrist flexion 5   Wrist extension 5   Wrist ulnar deviation    Wrist radial deviation    Wrist pronation  Wrist supination    Grip strength  fair fair  (Blank rows = not tested)  SHOULDER SPECIAL TESTS:  NT  JOINT MOBILITY TESTING:  NT  PALPATION:  NA   TODAY'S TREATMENT:  04/23/22 Therapeutic Exercise: Pulleys 2 min flex and 2 min scap Supine chest press w/ cane x 10 Supine serratus punch w/ cane x10 Supine shld flexion w/ cane x 10 Standing finger ladder into flex x 10 Standing counter slide into flexion x 10 Reviewed pendulums  Manual Therapy: Gentle PROM to L shoulder all motions  04/17/2022 Therapeutic Exercise: to improve strength and mobility.  Demo, verbal and tactile cues throughout for technique. - Putty Squeezes with YT putty - Finger Pinch and Pull with Putty with YT putty - Flexion-Extension Shoulder Pendulum with Table Support  -10 reps - Seated Scapular Retraction  10 reps   PATIENT EDUCATION: Education details: findings, POC, initial HEP, issued Yellow theraputty.  Educated on performing exercises gently, should be pain free at all times.   Person educated: Patient Education method: Explanation, Demonstration, Verbal cues, and Handouts Education comprehension: verbalized understanding and returned demonstration   HOME EXERCISE PROGRAM: Access Code: [email or web address] URL: [email or web address] Date: 04/23/2022 Prepared by: Clarene Essex  Exercises - Flexion-Extension Shoulder Pendulum with Table Support  - 3 x daily - 7 x weekly - 10 reps - Seated Scapular Retraction  - 3 x daily - 7 x weekly - 10 reps - Supine Shoulder Flexion Extension AAROM with Dowel  - 1 x daily - 7 x weekly - 2 sets - 10 reps - Hooklying Scapular Protraction on Foam Roll  - 1 x daily  - 7 x weekly - 2 sets - 10 reps - Standing Shoulder Flexion Full Range  - 1 x daily - 7 x weekly - 2 sets - 10 reps  ASSESSMENT:  CLINICAL IMPRESSION: Pt responded well to the initial progression of exercises. She demonstrated good upright ROM of L shoulder so we progressed AA and AROM. Cues needed with pendulums to allow body momentum to move shld vs shld to move. Cues prn during PROM to relax. Pt would continue to benefit from progressing ROM adhering to post op restrictions.  OBJECTIVE IMPAIRMENTS decreased activity tolerance, decreased endurance, decreased ROM, decreased strength, increased edema, increased fascial restrictions, impaired perceived functional ability, increased muscle spasms, impaired flexibility, impaired UE functional use, postural dysfunction, and pain.   ACTIVITY LIMITATIONS carrying, lifting, bending, transfers, bed mobility, bathing, dressing, self feeding, reach over head, and hygiene/grooming  PARTICIPATION LIMITATIONS: meal prep, cleaning, laundry, interpersonal relationship, driving, shopping, community activity, and occupation  PERSONAL FACTORS 3+ comorbidities: history R RTC repair, carpel tunnel syndrome, OA wrists, T2DM  are also affecting patient's functional outcome.   REHAB POTENTIAL: Good  CLINICAL DECISION MAKING: Stable/uncomplicated  EVALUATION COMPLEXITY: Low   GOALS: Goals reviewed with patient? Yes  SHORT TERM GOALS: Target date: 05/01/2022   Patient will be independent with initial HEP.  Baseline: given Goal status: INITIAL  2.  Patient will demonstrate improved L wrist/hand edema  Baseline: significant edema in wrist/hand limiting wrist ROM Goal status: INITIAL   LONG TERM GOALS: Target date: 06/26/2022   Patient will be independent with advanced/ongoing HEP to improve outcomes and carryover.  Baseline: needs progression Goal status: INITIAL  2.  Patient will demonstrate L wrist AROM = R wrist AROM to perform ADLs.   Baseline:  see objective Goal status: INITIAL  3.  Patient will demonstrate full L elbow ROM to perform ADLs.  Baseline: see objective  Goal status: INITIAL  4.  Patient to improve L shoulder AROM to [facility] without pain provocation to allow for increased ease of ADLs.  Baseline: see objective Goal status: INITIAL  5.  Patient will demonstrate improved functional UE strength as demonstrated by 4+/5 shoulder strength. Baseline: see objective Goal status: INITIAL  6  Patient will report <50% impairment on QuickDash to demonstrate improved functional ability.  Baseline: 63.6% Goal status: INITIAL  PLAN: PT FREQUENCY: 1-2x/week  PT DURATION: 10 weeks  PLANNED INTERVENTIONS: Therapeutic exercises, Therapeutic activity, Neuromuscular re-education, Balance training, Gait training, Patient/Family education, Self Care, Joint mobilization, Dry Needling, Spinal mobilization, Cryotherapy, Moist heat, scar mobilization, Vasopneumatic device, Ultrasound, Manual therapy, and Re-evaluation  PLAN FOR NEXT SESSION:  Review HEP, progress full ROM for Left elbow, forearm and hand.  AAROM L shoulder to tolerance.  NWB L UE, no resistance or lifting.  Modalities PRN.     Darleene Cleaver, PTA 04/23/2022, 6:01 PM

## 2022-04-28 ENCOUNTER — Ambulatory Visit: Payer: Medicare HMO | Admitting: Physical Therapy

## 2022-05-01 ENCOUNTER — Ambulatory Visit: Payer: Medicare HMO | Admitting: Physical Therapy

## 2022-05-01 ENCOUNTER — Encounter: Payer: Self-pay | Admitting: Physical Therapy

## 2022-05-01 DIAGNOSIS — M25612 Stiffness of left shoulder, not elsewhere classified: Secondary | ICD-10-CM

## 2022-05-01 DIAGNOSIS — M79622 Pain in left upper arm: Secondary | ICD-10-CM

## 2022-05-01 DIAGNOSIS — M25622 Stiffness of left elbow, not elsewhere classified: Secondary | ICD-10-CM

## 2022-05-01 DIAGNOSIS — R600 Localized edema: Secondary | ICD-10-CM

## 2022-05-01 DIAGNOSIS — M25632 Stiffness of left wrist, not elsewhere classified: Secondary | ICD-10-CM

## 2022-05-01 DIAGNOSIS — M6281 Muscle weakness (generalized): Secondary | ICD-10-CM

## 2022-05-01 NOTE — Therapy (Signed)
OUTPATIENT PHYSICAL THERAPY TREATMENT   Patient Name: Krystal James MRN: [id number] DOB:06-10-1951, 71 y.o., female Today's Date: 05/01/2022   PT End of Session - 05/01/22 1104     Visit Number 3    Number of Visits 20    Date for PT Re-Evaluation 06/26/22    Authorization Type [employer] MCR    Progress Note Due on Visit 10    PT Start Time 1104    PT Stop Time 1149    PT Time Calculation (min) 45 min    Activity Tolerance Patient tolerated treatment well    Behavior During Therapy WFL for tasks assessed/performed              Past Medical History:  Diagnosis Date   Diverticulosis of colon    GERD (gastroesophageal reflux disease)    Hiatal hernia    Hyperlipidemia    Hypertension    Nocturia more than twice per night    OA (osteoarthritis)    right and left knee,  hands   OA (osteoarthritis) of knee    right and left,     PONV (postoperative nausea and vomiting)    Type 2 diabetes mellitus ([place])    followed by pcp   Wears glasses    Past Surgical History:  Procedure Laterality Date   APPENDECTOMY  age 76   BREAST REDUCTION SURGERY Bilateral 1994   COLONOSCOPY  2018   ESOPHAGOGASTRODUODENOSCOPY  2017   KNEE ARTHROSCOPY Right 2014   dr Alvan Dame   SHOULDER OPEN ROTATOR CUFF REPAIR Right 10-21-2001  dr Collier [facility]   TOTAL KNEE ARTHROPLASTY Right 12/07/2017   Procedure: RIGHT TOTAL KNEE ARTHROPLASTY;  Surgeon: Paralee Cancel, MD;  Location: [facility];  Service: Orthopedics;  Laterality: Right;  90 mins   TOTAL KNEE ARTHROPLASTY Left 01/12/2018   Procedure: LEFT TOTAL KNEE ARTHROPLASTY;  Surgeon: Paralee Cancel, MD;  Location: [facility];  Service: Orthopedics;  Laterality: Left;  70 mins   TUBAL LIGATION Bilateral yrs ago   Patient Active Problem List   Diagnosis Date Noted   Pneumonia due to COVID-19 virus 01/02/2019   Essential hypertension 01/02/2019   Type 2 diabetes mellitus with hyperlipidemia ([place]) 01/02/2019   Obese 01/13/2018   S/P right TKA 12/07/2017   S/P  left TKA 12/07/2017   Gastroesophageal reflux disease without esophagitis 03/24/2014   Hyperlipidemia associated with type 2 diabetes mellitus ([place]) 03/24/2014   Hypertriglyceridemia 03/24/2014    PCP: Robyne Peers, MD  REFERRING PROVIDER: Tad Moore, PA-C  REFERRING DIAG: Aftercare following surgery of the musculoskeletal system    THERAPY DIAG:  Stiffness of left shoulder, not elsewhere classified  Stiffness of left elbow, not elsewhere classified  Stiffness of left wrist, not elsewhere classified  Muscle weakness (generalized)  Pain in left upper arm  Localized edema  Rationale for Evaluation and Treatment Rehabilitation  ONSET DATE: Injured 04/01/22, ORIF 04/07/2022  SUBJECTIVE:  SUBJECTIVE STATEMENT: Patient reports she has been having daily panic attacks last 4 days, why she had to cancel last visit.  Pain has been well controlled.     PERTINENT HISTORY: From MD notes on 04/15/2022: Marvene Strohm is a 71 y.o. year old female who presents today approximately 1 week following plate and screw fixation left midshaft humerus fracture. Patient states overall she is been doing well. She is had some swelling in her arm but has been working on motion of her elbow. She stopped wearing her sling about 3-4 days ago to work on motion of her arm throughout the day. She has been conservative with taking her pain medication as it does not make her feel well. No numbness or tingling. I placed an order for PT to work on range of motion and active assist motion of her shoulder as well as her elbow. She will follow up next Wednesday or Thursday with the cast tech in our office for staple removal and Steri-Strips placement. She may continue to shower and pat the incision dry.  PMH: R RTC repair, T2DM,  HTN, bil TKA, GERD, L CTS  PAIN:  Are you having pain? Yes: NPRS scale: 1/10 Pain location: L shoulder  Pain description: ache Aggravating factors: moving shoulder Relieving factors: has not needed medication  PRECAUTIONS: Shoulder  NWB LUE until 04/18/2022  WEIGHT BEARING RESTRICTIONS Yes NWB LUE x 6 weeks post-op (to 04/18/2022)  FALLS:  Has patient fallen in last 6 months? Yes. Number of falls 1 with injury  LIVING ENVIRONMENT: Lives with: lives with their spouse Lives in: House/apartment Stairs: Yes: External: 3 steps; can reach both Has following equipment at home: None  OCCUPATION: Works full time in office job, accounts payable.  Mostly sedentary.   PLOF: Independent  PATIENT GOALS get full use of left arm again.   OBJECTIVE:   DIAGNOSTIC FINDINGS:  04/15/2022 L humerus XR Obtained AP and lateral views of the left humerus.  Plate and screw fixation left midshaft humeral fracture maintaining great position.    Staples retained in the soft tissue.  No other findings.  PATIENT SURVEYS:  Quick Dash 63.6% impairment  COGNITION:  Overall cognitive status: Within functional limits for tasks assessed     OBSERVATION: Incision L upper arm with staples, no signs/symptoms infection.  Patient with no apparent distress.    EDEMA Increased edema in distal LUE especially wrist and hand. 17.5 cm R wrist, 20.5 cm L wrist.   SENSATION: WFL  POSTURE: Forward head, rounded shoulders  UPPER EXTREMITY ROM:   Active ROM Right AROM eval Left AROM eval  Shoulder flexion 145 72 (P ~120)  Shoulder extension 70 52   Shoulder abduction 125 48 (P ~80)  Shoulder adduction    Shoulder internal rotation  (P ~45)  Shoulder external rotation  (P ~ 45)  Elbow flexion 125 110  Elbow extension 0 Lacking 15  Wrist flexion 50 35  Wrist extension 15 0  Wrist ulnar deviation    Wrist radial deviation    Wrist pronation  70  Wrist supination  65  (Blank rows = not tested)  UPPER  EXTREMITY MMT:  LUE not formally MMT today due to post-op precautions.   MMT Right eval Left eval  Shoulder flexion 4+ 2+  Shoulder extension 4+ 2+  Shoulder abduction 4+ 2+  Shoulder adduction    Shoulder internal rotation 4+ NT  Shoulder external rotation 4+ NT  Elbow flexion 5   Elbow extension 5  Wrist flexion 5   Wrist extension 5   Wrist ulnar deviation    Wrist radial deviation    Wrist pronation    Wrist supination    Grip strength  fair fair  (Blank rows = not tested)  SHOULDER SPECIAL TESTS:  NT  JOINT MOBILITY TESTING:  NT  PALPATION:  NA   TODAY'S TREATMENT:  05/01/2022 Therapeutic Exercise: to improve strength and mobility.   Pulleys 3 min flexion and 3 min scaption Finger ladder x 10 Isometric exercises - scap squeezes 5 x 5 sec hold, elbow flexion 5 x 5 sec hold, elbow extension 5 x 5 sec hold, shoulder IR 5 x 5 sec hold, shoulder ER 5 x 5 sec hold, shoulder extension 5 x 5 sec hold.  Submax contraction only 25-50%.   Chin tucks x 10, shoulder rolls retro x 10 Manual Therapy: to decrease muscle spasm and pain and improve mobility STM/TPR to bil cervical paraspinals, UT, L L/S.  04/23/22 Therapeutic Exercise: Pulleys 2 min flex and 2 min scap Supine chest press w/ cane x 10 Supine serratus punch w/ cane x10 Supine shld flexion w/ cane x 10 Standing finger ladder into flex x 10 Standing counter slide into flexion x 10 Reviewed pendulums  Manual Therapy: Gentle PROM to L shoulder all motions  04/17/2022 Therapeutic Exercise: to improve strength and mobility.  Demo, verbal and tactile cues throughout for technique. - Putty Squeezes with YT putty - Finger Pinch and Pull with Putty with YT putty - Flexion-Extension Shoulder Pendulum with Table Support  -10 reps - Seated Scapular Retraction  10 reps   PATIENT EDUCATION: Education details: HEP update    Person educated: Patient Education method: Explanation, Demonstration, Verbal cues, and  Handouts Education comprehension: verbalized understanding and returned demonstration   HOME EXERCISE PROGRAM: Access Code: [email or web address] update 05/01/22  ASSESSMENT:  CLINICAL IMPRESSION: Krystal James is making good progress demonstrating pain free AROM L shoulder flexion ~ 120, and abduction ~ 90.  She tolerated progression of exercises today adding isometric shoulder and elbow strengthening, counseled throughout to just activate muscle, keeping about 25% submax and should feel no discomfort.  She had some difficulty using opposite hand for resistance due to hand OA, preferred pressing arm at wall.  End of session performed STM/TPR to bil UT/LS and cervical paraspinals, she found this extremely helpful to decrease pain.  Educated on posture and added chin tucks as well as tends to maintain forward head, rounded shoulder posture.  Krystal James continues to demonstrate potential for improvement and would benefit from continued skilled therapy to address impairments.     OBJECTIVE IMPAIRMENTS decreased activity tolerance, decreased endurance, decreased ROM, decreased strength, increased edema, increased fascial restrictions, impaired perceived functional ability, increased muscle spasms, impaired flexibility, impaired UE functional use, postural dysfunction, and pain.   ACTIVITY LIMITATIONS carrying, lifting, bending, transfers, bed mobility, bathing, dressing, self feeding, reach over head, and hygiene/grooming  PARTICIPATION LIMITATIONS: meal prep, cleaning, laundry, interpersonal relationship, driving, shopping, community activity, and occupation  PERSONAL FACTORS 3+ comorbidities: history R RTC repair, carpel tunnel syndrome, OA wrists, T2DM  are also affecting patient's functional outcome.   REHAB POTENTIAL: Good  CLINICAL DECISION MAKING: Stable/uncomplicated  EVALUATION COMPLEXITY: Low   GOALS: Goals reviewed with patient? Yes  SHORT TERM GOALS: Target date: 05/01/2022   Patient  will be independent with initial HEP.  Baseline: given Goal status: MET 05/01/22- compliant  2.  Patient will demonstrate improved L wrist/hand edema  Baseline: significant edema  in wrist/hand limiting wrist ROM Goal status: IN PROGRESS   LONG TERM GOALS: Target date: 06/26/2022   Patient will be independent with advanced/ongoing HEP to improve outcomes and carryover.  Baseline: needs progression Goal status: IN PROGRESS  2.  Patient will demonstrate L wrist AROM = R wrist AROM to perform ADLs.   Baseline: see objective Goal status: IN PROGRESS  3.  Patient will demonstrate full L elbow ROM to perform ADLs.  Baseline: see objective Goal status: IN PROGRESS  4.  Patient to improve L shoulder AROM to [facility] without pain provocation to allow for increased ease of ADLs.  Baseline: see objective Goal status: IN PROGRESS  5.  Patient will demonstrate improved functional UE strength as demonstrated by 4+/5 shoulder strength. Baseline: see objective Goal status: IN PROGRESS  6  Patient will report <50% impairment on QuickDash to demonstrate improved functional ability.  Baseline: 63.6% Goal status: IN PROGRESS  PLAN: PT FREQUENCY: 1-2x/week  PT DURATION: 10 weeks  PLANNED INTERVENTIONS: Therapeutic exercises, Therapeutic activity, Neuromuscular re-education, Balance training, Gait training, Patient/Family education, Self Care, Joint mobilization, Dry Needling, Spinal mobilization, Cryotherapy, Moist heat, scar mobilization, Vasopneumatic device, Ultrasound, Manual therapy, and Re-evaluation  PLAN FOR NEXT SESSION: continue per protocol, NWB LUE, post op week for on 05/05/22   [name], PT, DPT 05/01/2022, 12:01 PM

## 2022-05-05 ENCOUNTER — Ambulatory Visit: Payer: Medicare HMO | Admitting: Physical Therapy

## 2022-05-05 ENCOUNTER — Encounter: Payer: Self-pay | Admitting: Physical Therapy

## 2022-05-05 DIAGNOSIS — M25612 Stiffness of left shoulder, not elsewhere classified: Secondary | ICD-10-CM

## 2022-05-05 DIAGNOSIS — R600 Localized edema: Secondary | ICD-10-CM

## 2022-05-05 DIAGNOSIS — M25632 Stiffness of left wrist, not elsewhere classified: Secondary | ICD-10-CM

## 2022-05-05 DIAGNOSIS — M25622 Stiffness of left elbow, not elsewhere classified: Secondary | ICD-10-CM

## 2022-05-05 DIAGNOSIS — M6281 Muscle weakness (generalized): Secondary | ICD-10-CM

## 2022-05-05 DIAGNOSIS — M79622 Pain in left upper arm: Secondary | ICD-10-CM

## 2022-05-05 NOTE — Therapy (Addendum)
OUTPATIENT PHYSICAL THERAPY TREATMENT   Patient Name: Krystal James MRN: [id number] DOB:1951/01/11, 71 y.o., female Today's Date: 05/05/2022   PT End of Session - 05/05/22 1021     Visit Number 4    Number of Visits 20    Date for PT Re-Evaluation 06/26/22    Authorization Type [employer] MCR    Progress Note Due on Visit 10    PT Start Time 1018    PT Stop Time 1102    PT Time Calculation (min) 44 min    Activity Tolerance Patient tolerated treatment well    Behavior During Therapy WFL for tasks assessed/performed              Past Medical History:  Diagnosis Date   Diverticulosis of colon    GERD (gastroesophageal reflux disease)    Hiatal hernia    Hyperlipidemia    Hypertension    Nocturia more than twice per night    OA (osteoarthritis)    right and left knee,  hands   OA (osteoarthritis) of knee    right and left,     PONV (postoperative nausea and vomiting)    Type 2 diabetes mellitus ([place])    followed by pcp   Wears glasses    Past Surgical History:  Procedure Laterality Date   APPENDECTOMY  age 39   BREAST REDUCTION SURGERY Bilateral 1994   COLONOSCOPY  2018   ESOPHAGOGASTRODUODENOSCOPY  2017   KNEE ARTHROSCOPY Right 2014   dr Alvan Dame   SHOULDER OPEN ROTATOR CUFF REPAIR Right 10-21-2001  dr Collier [facility]   TOTAL KNEE ARTHROPLASTY Right 12/07/2017   Procedure: RIGHT TOTAL KNEE ARTHROPLASTY;  Surgeon: Paralee Cancel, MD;  Location: [facility];  Service: Orthopedics;  Laterality: Right;  90 mins   TOTAL KNEE ARTHROPLASTY Left 01/12/2018   Procedure: LEFT TOTAL KNEE ARTHROPLASTY;  Surgeon: Paralee Cancel, MD;  Location: [facility];  Service: Orthopedics;  Laterality: Left;  70 mins   TUBAL LIGATION Bilateral yrs ago   Patient Active Problem List   Diagnosis Date Noted   Pneumonia due to COVID-19 virus 01/02/2019   Essential hypertension 01/02/2019   Type 2 diabetes mellitus with hyperlipidemia ([place]) 01/02/2019   Obese 01/13/2018   S/P right TKA 12/07/2017   S/P  left TKA 12/07/2017   Gastroesophageal reflux disease without esophagitis 03/24/2014   Hyperlipidemia associated with type 2 diabetes mellitus ([place]) 03/24/2014   Hypertriglyceridemia 03/24/2014    PCP: Robyne Peers, MD  REFERRING PROVIDER: Tad Moore, PA-C  REFERRING DIAG: Aftercare following surgery of the musculoskeletal system    THERAPY DIAG:  Stiffness of left shoulder, not elsewhere classified  Stiffness of left elbow, not elsewhere classified  Stiffness of left wrist, not elsewhere classified  Muscle weakness (generalized)  Pain in left upper arm  Localized edema  Rationale for Evaluation and Treatment Rehabilitation  ONSET DATE: Injured 04/01/22, ORIF 04/07/2022  SUBJECTIVE:  SUBJECTIVE STATEMENT: Patient reports she has not had any new panic attacks.  Martin Majestic out of town for couple days so hasn't been doing her exercises consistently this weekend.     PERTINENT HISTORY: From MD notes on 04/15/2022: Krystal James is a 71 y.o. year old female who presents today approximately 1 week following plate and screw fixation left midshaft humerus fracture. Patient states overall she is been doing well. She is had some swelling in her arm but has been working on motion of her elbow. She stopped wearing her sling about 3-4 days ago to work on motion of her arm throughout the day. She has been conservative with taking her pain medication as it does not make her feel well. No numbness or tingling. I placed an order for PT to work on range of motion and active assist motion of her shoulder as well as her elbow. She will follow up next Wednesday or Thursday with the cast tech in our office for staple removal and Steri-Strips placement. She may continue to shower and pat the incision dry.  PMH: R RTC  repair, T2DM, HTN, bil TKA, GERD, L CTS  PAIN:  Are you having pain? Yes: NPRS scale: 0/10 Pain location: L shoulder  Pain description: ache Aggravating factors: moving shoulder Relieving factors: has not needed medication  PRECAUTIONS: Shoulder  NWB LUE until 04/18/2022  WEIGHT BEARING RESTRICTIONS Yes NWB LUE x 6 weeks post-op (to 05/19/2022)  FALLS:  Has patient fallen in last 6 months? Yes. Number of falls 1 with injury  LIVING ENVIRONMENT: Lives with: lives with their spouse Lives in: House/apartment Stairs: Yes: External: 3 steps; can reach both Has following equipment at home: None  OCCUPATION: Works full time in office job, accounts payable.  Mostly sedentary.   PLOF: Independent  PATIENT GOALS get full use of left arm again.   OBJECTIVE:   DIAGNOSTIC FINDINGS:  04/15/2022 L humerus XR Obtained AP and lateral views of the left humerus.  Plate and screw fixation left midshaft humeral fracture maintaining great position.    Staples retained in the soft tissue.  No other findings.  PATIENT SURVEYS:  Quick Dash 63.6% impairment  COGNITION:  Overall cognitive status: Within functional limits for tasks assessed     OBSERVATION: Incision L upper arm with staples, no signs/symptoms infection.  Patient with no apparent distress.    EDEMA Increased edema in distal LUE especially wrist and hand. 17.5 cm R wrist, 20.5 cm L wrist.   SENSATION: WFL  POSTURE: Forward head, rounded shoulders  UPPER EXTREMITY ROM:   Active ROM Right AROM eval Left AROM eval  Shoulder flexion 145 72 (P ~120)  Shoulder extension 70 52   Shoulder abduction 125 48 (P ~80)  Shoulder adduction    Shoulder internal rotation  (P ~45)  Shoulder external rotation  (P ~ 45)  Elbow flexion 125 110  Elbow extension 0 Lacking 15  Wrist flexion 50 35  Wrist extension 15 0  Wrist ulnar deviation    Wrist radial deviation    Wrist pronation  70  Wrist supination  65  (Blank rows = not  tested)  UPPER EXTREMITY MMT:  LUE not formally MMT today due to post-op precautions.   MMT Right eval Left eval  Shoulder flexion 4+ 2+  Shoulder extension 4+ 2+  Shoulder abduction 4+ 2+  Shoulder adduction    Shoulder internal rotation 4+ NT  Shoulder external rotation 4+ NT  Elbow flexion 5   Elbow extension 5  Wrist flexion 5   Wrist extension 5   Wrist ulnar deviation    Wrist radial deviation    Wrist pronation    Wrist supination    Grip strength  fair fair  (Blank rows = not tested)  SHOULDER SPECIAL TESTS:  NT  JOINT MOBILITY TESTING:  NT  PALPATION:  NA   TODAY'S TREATMENT:  05/05/2022 Therapeutic Exercise: to improve strength and mobility.  Demo, verbal and tactile cues throughout for technique. Pulleys 3 min flexion and 3 min scaption Finger ladder x 10 Wall slides flexion x 15 Standing Isometric exercises - scap squeezes 5 x 5 sec hold, shoulder IR 5 x 5 sec hold, shoulder ER 5 x 5 sec hold, shoulder extension 5 x 5 sec hold.  Submax contraction only 50%.   Chin tucks x 10, shoulder rolls retro x 10 Manual Therapy: to decrease muscle spasm and pain and improve mobility STM/TPR to bil cervical paraspinals, UT, L L/S.  Scar tissue mobilization Incision.    05/01/2022 Therapeutic Exercise: to improve strength and mobility.   Pulleys 3 min flexion and 3 min scaption Finger ladder x 10 Isometric exercises - scap squeezes 5 x 5 sec hold, elbow flexion 5 x 5 sec hold, elbow extension 5 x 5 sec hold, shoulder IR 5 x 5 sec hold, shoulder ER 5 x 5 sec hold, shoulder extension 5 x 5 sec hold.  Submax contraction only 25-50%.   Chin tucks x 10, shoulder rolls retro x 10 Manual Therapy: to decrease muscle spasm and pain and improve mobility STM/TPR to bil cervical paraspinals, UT, L L/S.  04/23/22 Therapeutic Exercise: Pulleys 2 min flex and 2 min scap Supine chest press w/ cane x 10 Supine serratus punch w/ cane x10 Supine shld flexion w/ cane x 10 Standing  finger ladder into flex x 10 Standing counter slide into flexion x 10 Reviewed pendulums  Manual Therapy: Gentle PROM to L shoulder all motions  04/17/2022 Therapeutic Exercise: to improve strength and mobility.  Demo, verbal and tactile cues throughout for technique. - Putty Squeezes with YT putty - Finger Pinch and Pull with Putty with YT putty - Flexion-Extension Shoulder Pendulum with Table Support  -10 reps - Seated Scapular Retraction  10 reps   PATIENT EDUCATION: Education details: HEP update    Person educated: Patient Education method: Explanation, Demonstration, Verbal cues, and Handouts Education comprehension: verbalized understanding and returned demonstration   HOME EXERCISE PROGRAM: Access Code: [email or web address] update 05/01/22  ASSESSMENT:  CLINICAL IMPRESSION: Holly Iannaccone Isip continues to make good progress, demonstrated improving AROM, able to raise L arm as high as R without pain.  Incision is healing well, noted what appeared to be internal stitches raising up through incision, she will call doctor's attention to them when returns on Friday.  Progressed AAROM to add wall slides, no pain with exercise. Still had a lot of muscle tightness noted with manual therapy, reports decreased tightness following.  Haddy Mullinax Meyn continues to demonstrate potential for improvement and would benefit from continued skilled therapy to address impairments.     OBJECTIVE IMPAIRMENTS decreased activity tolerance, decreased endurance, decreased ROM, decreased strength, increased edema, increased fascial restrictions, impaired perceived functional ability, increased muscle spasms, impaired flexibility, impaired UE functional use, postural dysfunction, and pain.   ACTIVITY LIMITATIONS carrying, lifting, bending, transfers, bed mobility, bathing, dressing, self feeding, reach over head, and hygiene/grooming  PARTICIPATION LIMITATIONS: meal prep, cleaning, laundry, interpersonal relationship,  driving, shopping, community activity, and occupation  PERSONAL FACTORS 3+  comorbidities: history R RTC repair, carpel tunnel syndrome, OA wrists, T2DM  are also affecting patient's functional outcome.   REHAB POTENTIAL: Good  CLINICAL DECISION MAKING: Stable/uncomplicated  EVALUATION COMPLEXITY: Low   GOALS: Goals reviewed with patient? Yes  SHORT TERM GOALS: Target date: 05/01/2022   Patient will be independent with initial HEP.  Baseline: given Goal status: MET 05/01/22- compliant  2.  Patient will demonstrate improved L wrist/hand edema  Baseline: significant edema in wrist/hand limiting wrist ROM Goal status: IN PROGRESS   LONG TERM GOALS: Target date: 06/26/2022   Patient will be independent with advanced/ongoing HEP to improve outcomes and carryover.  Baseline: needs progression Goal status: IN PROGRESS  2.  Patient will demonstrate L wrist AROM = R wrist AROM to perform ADLs.   Baseline: see objective Goal status: IN PROGRESS  3.  Patient will demonstrate full L elbow ROM to perform ADLs.  Baseline: see objective Goal status: IN PROGRESS  4.  Patient to improve L shoulder AROM to [facility] without pain provocation to allow for increased ease of ADLs.  Baseline: see objective Goal status: IN PROGRESS  5.  Patient will demonstrate improved functional UE strength as demonstrated by 4+/5 shoulder strength. Baseline: see objective Goal status: IN PROGRESS  6  Patient will report <50% impairment on QuickDash to demonstrate improved functional ability.  Baseline: 63.6% Goal status: IN PROGRESS  PLAN: PT FREQUENCY: 1-2x/week  PT DURATION: 10 weeks  PLANNED INTERVENTIONS: Therapeutic exercises, Therapeutic activity, Neuromuscular re-education, Balance training, Gait training, Patient/Family education, Self Care, Joint mobilization, Dry Needling, Spinal mobilization, Cryotherapy, Moist heat, scar mobilization, Vasopneumatic device, Ultrasound, Manual therapy, and  Re-evaluation  PLAN FOR NEXT SESSION: continue per protocol, post op week for on 05/05/22.  NWB LUE to 05/19/22. Take ROM and send MD note next visit.    [name], PT, DPT 05/05/2022, 12:00 PM

## 2022-05-07 NOTE — Therapy (Addendum)
PHYSICAL THERAPY DISCHARGE SUMMARY  Visits from Start of Care: 5  Current functional level related to goals / functional outcomes: Decreased L arm pain, improved AROM, good progress towards all goals.    Remaining deficits: Strength deficits - still limited by post-op protocols   Education / Equipment: HEP  Plan: Patient agrees to discharge.  Patient is being discharged due to being released from PT by referring MD.  Patient called on 05/09/2022 to cancel remaining visits.    Krystal James, PT, DPT 4:06 PM 05/22/2022    OUTPATIENT PHYSICAL THERAPY TREATMENT   Patient Name: Krystal James MRN: [id number] DOB:October 30, 1950, 71 y.o., female Today's Date: 05/08/2022   PT End of Session - 05/08/22 1017     Visit Number 5    Number of Visits 20    Date for PT Re-Evaluation 06/26/22    Authorization Type [employer] MCR    Progress Note Due on Visit 10    PT Start Time 1017    PT Stop Time 1103    PT Time Calculation (min) 46 min    Activity Tolerance Patient tolerated treatment well    Behavior During Therapy WFL for tasks assessed/performed               Past Medical History:  Diagnosis Date   Diverticulosis of colon    GERD (gastroesophageal reflux disease)    Hiatal hernia    Hyperlipidemia    Hypertension    Nocturia more than twice per night    OA (osteoarthritis)    right and left knee,  hands   OA (osteoarthritis) of knee    right and left,     PONV (postoperative nausea and vomiting)    Type 2 diabetes mellitus ([place])    followed by pcp   Wears glasses    Past Surgical History:  Procedure Laterality Date   APPENDECTOMY  age 53   BREAST REDUCTION SURGERY Bilateral 1994   COLONOSCOPY  2018   ESOPHAGOGASTRODUODENOSCOPY  2017   KNEE ARTHROSCOPY Right 2014   dr Alvan Dame   SHOULDER OPEN ROTATOR CUFF REPAIR Right 10-21-2001  dr Collier [facility]   TOTAL KNEE ARTHROPLASTY Right 12/07/2017   Procedure: RIGHT TOTAL KNEE ARTHROPLASTY;  Surgeon: Paralee Cancel, MD;   Location: [facility];  Service: Orthopedics;  Laterality: Right;  90 mins   TOTAL KNEE ARTHROPLASTY Left 01/12/2018   Procedure: LEFT TOTAL KNEE ARTHROPLASTY;  Surgeon: Paralee Cancel, MD;  Location: [facility];  Service: Orthopedics;  Laterality: Left;  70 mins   TUBAL LIGATION Bilateral yrs ago   Patient Active Problem List   Diagnosis Date Noted   Pneumonia due to COVID-19 virus 01/02/2019   Essential hypertension 01/02/2019   Type 2 diabetes mellitus with hyperlipidemia ([place]) 01/02/2019   Obese 01/13/2018   S/P right TKA 12/07/2017   S/P left TKA 12/07/2017   Gastroesophageal reflux disease without esophagitis 03/24/2014   Hyperlipidemia associated with type 2 diabetes mellitus ([place]) 03/24/2014   Hypertriglyceridemia 03/24/2014    PCP: Robyne Peers, MD  REFERRING PROVIDER: Tad Moore, PA-C  REFERRING DIAG: Aftercare following surgery of the musculoskeletal system    THERAPY DIAG:  Stiffness of left shoulder, not elsewhere classified  Stiffness of left elbow, not elsewhere classified  Stiffness of left wrist, not elsewhere classified  Muscle weakness (generalized)  Pain in left upper arm  Localized edema  Rationale for Evaluation and Treatment Rehabilitation  ONSET DATE: Injured 04/01/22, ORIF 04/07/2022  SUBJECTIVE:  SUBJECTIVE STATEMENT: Overall pt reports she is doing well and has no limitations at home. She is not deliberately picking up anything real heavy right now.  She reports no pain at all.    PERTINENT HISTORY: From MD notes on 04/15/2022: Krystal James is a 71 y.o. year old female who presents today approximately 1 week following plate and screw fixation left midshaft humerus fracture. Patient states overall she is been doing well. She is had some swelling in her arm but has  been working on motion of her elbow. She stopped wearing her sling about 3-4 days ago to work on motion of her arm throughout the day. She has been conservative with taking her pain medication as it does not make her feel well. No numbness or tingling. I placed an order for PT to work on range of motion and active assist motion of her shoulder as well as her elbow. She will follow up next Wednesday or Thursday with the cast tech in our office for staple removal and Steri-Strips placement. She may continue to shower and pat the incision dry.  PMH: R RTC repair, T2DM, HTN, bil TKA, GERD, L CTS  PAIN:  Are you having pain? Yes: NPRS scale: 0/10 Pain location: L shoulder  Pain description: ache Aggravating factors: moving shoulder Relieving factors: has not needed medication  PRECAUTIONS: Shoulder  NWB LUE until 04/18/2022  WEIGHT BEARING RESTRICTIONS Yes NWB LUE x 6 weeks post-op (to 05/19/2022)  FALLS:  Has patient fallen in last 6 months? Yes. Number of falls 1 with injury  LIVING ENVIRONMENT: Lives with: lives with their spouse Lives in: House/apartment Stairs: Yes: External: 3 steps; can reach both Has following equipment at home: None  OCCUPATION: Works full time in office job, accounts payable.  Mostly sedentary.   PLOF: Independent  PATIENT GOALS get full use of left arm again.   OBJECTIVE:   DIAGNOSTIC FINDINGS:  04/15/2022 L humerus XR Obtained AP and lateral views of the left humerus.  Plate and screw fixation left midshaft humeral fracture maintaining great position.    Staples retained in the soft tissue.  No other findings.  PATIENT SURVEYS:  Quick Dash 63.6% impairment  COGNITION:  Overall cognitive status: Within functional limits for tasks assessed     OBSERVATION: Incision L upper arm with staples, no signs/symptoms infection.  Patient with no apparent distress.    EDEMA Increased edema in distal LUE especially wrist and hand. 17.5 cm R wrist, 20.5 cm L wrist.    SENSATION: WFL  POSTURE: Forward head, rounded shoulders  UPPER EXTREMITY ROM:   Active ROM Right AROM eval Left AROM eval Left AROM 05/08/22  Shoulder flexion 145 72 (P ~120)   Shoulder extension 70 52  59 (R measures 59 also)  Shoulder abduction 125 48 (P ~80) 170  Shoulder adduction     Shoulder internal rotation  (P ~45) 60  Shoulder external rotation  (P ~ 45) 75  Elbow flexion 125 110 145  Elbow extension 0 Lacking 15 -15  Wrist flexion 50 35 47  Wrist extension 15 0 28  Wrist ulnar deviation     Wrist radial deviation     Wrist pronation  70   Wrist supination  65   (Blank rows = not tested)  UPPER EXTREMITY MMT:  LUE not formally MMT today due to post-op precautions.   MMT Right eval Left eval  Shoulder flexion 4+ 2+  Shoulder extension 4+ 2+  Shoulder abduction 4+ 2+  Shoulder  adduction    Shoulder internal rotation 4+ NT  Shoulder external rotation 4+ NT  Elbow flexion 5   Elbow extension 5   Wrist flexion 5   Wrist extension 5   Wrist ulnar deviation    Wrist radial deviation    Wrist pronation    Wrist supination    Grip strength  fair fair  (Blank rows = not tested)  SHOULDER SPECIAL TESTS:  NT  JOINT MOBILITY TESTING:  NT  PALPATION:  NA   TODAY'S TREATMENT:  05/08/2022 Therapeutic Exercise: to improve strength and mobility.  Demo, verbal and tactile cues throughout for technique. Pulleys 3 min flexion and 3 min scaption Finger ladder x 10 Wall slides flexion x 15 Seated wrist flexion/extension x 10 ea Seated wrist flexion stretch and extension  2x30sec; various extension stretches demonstrated using table and wall as well as prayer stretch and returned demo. Standing biceps stretch for elbow extension 2x30 sec at wall.   Manual Therapy: Measurements taken for MD note    05/05/2022 Therapeutic Exercise: to improve strength and mobility.  Demo, verbal and tactile cues throughout for technique. Pulleys 3 min flexion and 3 min  scaption Finger ladder x 10 Wall slides flexion x 15 Standing Isometric exercises - scap squeezes 5 x 5 sec hold, shoulder IR 5 x 5 sec hold, shoulder ER 5 x 5 sec hold, shoulder extension 5 x 5 sec hold.  Submax contraction only 50%.   Chin tucks x 10, shoulder rolls retro x 10 Manual Therapy: to decrease muscle spasm and pain and improve mobility STM/TPR to bil cervical paraspinals, UT, L L/S.  Scar tissue mobilization Incision.    05/01/2022 Therapeutic Exercise: to improve strength and mobility.   Pulleys 3 min flexion and 3 min scaption Finger ladder x 10 Isometric exercises - scap squeezes 5 x 5 sec hold, elbow flexion 5 x 5 sec hold, elbow extension 5 x 5 sec hold, shoulder IR 5 x 5 sec hold, shoulder ER 5 x 5 sec hold, shoulder extension 5 x 5 sec hold.  Submax contraction only 25-50%.   Chin tucks x 10, shoulder rolls retro x 10 Manual Therapy: to decrease muscle spasm and pain and improve mobility STM/TPR to bil cervical paraspinals, UT, L L/S.  04/23/22 Therapeutic Exercise: Pulleys 2 min flex and 2 min scap Supine chest press w/ cane x 10 Supine serratus punch w/ cane x10 Supine shld flexion w/ cane x 10 Standing finger ladder into flex x 10 Standing counter slide into flexion x 10 Reviewed pendulums  Manual Therapy: Gentle PROM to L shoulder all motions  04/17/2022 Therapeutic Exercise: to improve strength and mobility.  Demo, verbal and tactile cues throughout for technique. - Putty Squeezes with YT putty - Finger Pinch and Pull with Putty with YT putty - Flexion-Extension Shoulder Pendulum with Table Support  -10 reps - Seated Scapular Retraction  10 reps   PATIENT EDUCATION: Education details: HEP update   05/08/22 Person educated: Patient Education method: Explanation, Demonstration, Verbal cues, and Handouts Education comprehension: verbalized understanding and returned demonstration   HOME EXERCISE PROGRAM: Access Code: [email or web address] URL:  [email or web address] Date: 05/08/2022 Prepared by: Almyra Free  Exercises - Flexion-Extension Shoulder Pendulum with Table Support  - 3 x daily - 7 x weekly - 10 reps - Seated Scapular Retraction  - 3 x daily - 7 x weekly - 10 reps - Supine Shoulder Flexion Extension AAROM with Dowel  - 1 x daily - 7 x weekly - 2 sets -  10 reps - Hooklying Scapular Protraction on Foam Roll  - 1 x daily - 7 x weekly - 2 sets - 10 reps - Standing Shoulder Flexion Full Range  - 1 x daily - 7 x weekly - 2 sets - 10 reps - Standing Isometric Shoulder Internal Rotation at Doorway  - 2 x daily - 7 x weekly - 1 sets - 5 reps - 5 sec hold - Isometric Shoulder External Rotation at Wall  - 1 x daily - 7 x weekly - 1 sets - 5 reps - 5 sec  hold - Isometric Shoulder Extension at Wall  - 1 x daily - 7 x weekly - 1 sets - 5 reps - 5 sec  hold - Isometric Shoulder Flexion at Wall  - 1 x daily - 7 x weekly - 1 sets - 5 reps - 5 sec  hold - Seated Isometric Elbow Flexion  - 1 x daily - 7 x weekly - 1 sets - 5 reps - 5 sec hold - Seated Isometric Elbow Extension  - 1 x daily - 7 x weekly - 1 sets - 5 reps - 5 sec hold - Seated Cervical Retraction  - 1 x daily - 7 x weekly - 1 sets - 10 reps - Wrist Prayer Stretch  - 2 x daily - 7 x weekly - 1 sets - 3 reps - 30 sec hold - Seated Wrist Flexion with Overpressure  - 2 x daily - 7 x weekly - 1 sets - 3 reps - 30 sec hold - Wrist Flexion Extension AROM - Palms Down  - 1 x daily - 7 x weekly - 3 sets - 10 reps - Bicep Stretch at Table  - 1 x daily - 7 x weekly - 1 sets - 3 reps - 30 sec hold  ASSESSMENT:  CLINICAL IMPRESSION: Krystal James is progressing very well with her LTGs. She has full functional left shoulder ROM and reports no pain. She denies any problems with ADLS. Precautions for WB and lifting were reviewed as she is only 4 weeks out of surgery. 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 She still has limitations in wrist and elbow ROM and HEP was  progressed to address these deficits. She would benefit from ongoing skilled PT to address remaining LTGs, particularly strengthening when protocol allows. She plans to discuss RTW with MD at her next appt. She may benefit from doing her HEP until strengthening is allowed and then return to skilled PT.   OBJECTIVE IMPAIRMENTS decreased activity tolerance, decreased endurance, decreased ROM, decreased strength, increased edema, increased fascial restrictions, impaired perceived functional ability, increased muscle spasms, impaired flexibility, impaired UE functional use, postural dysfunction, and pain.   ACTIVITY LIMITATIONS carrying, lifting, bending, transfers, bed mobility, bathing, dressing, self feeding, reach over head, and hygiene/grooming  PARTICIPATION LIMITATIONS: meal prep, cleaning, laundry, interpersonal relationship, driving, shopping, community activity, and occupation  PERSONAL FACTORS 3+ comorbidities: history R RTC repair, carpel tunnel syndrome, OA wrists, T2DM  are also affecting patient's functional outcome.   REHAB POTENTIAL: Good  CLINICAL DECISION MAKING: Stable/uncomplicated  EVALUATION COMPLEXITY: Low   GOALS: Goals reviewed with patient? Yes  SHORT TERM GOALS: Target date: 05/01/2022   Patient will be independent with initial HEP.  Baseline: given Goal status: MET 05/01/22- compliant  2.  Patient will demonstrate improved L wrist/hand edema  Baseline: significant edema in wrist/hand limiting wrist ROM Goal status: IN PROGRESS   LONG TERM GOALS:  Target date: 06/26/2022   Patient will be independent with advanced/ongoing HEP to improve outcomes and carryover.  Baseline: needs progression Goal status: IN PROGRESS  2.  Patient will demonstrate L wrist AROM = R wrist AROM to perform ADLs.   Baseline: see objective Goal status: IN PROGRESS  3.  Patient will demonstrate full L elbow ROM to perform ADLs.  Baseline: see objective Goal status: IN  PROGRESS  4.  Patient to improve L shoulder AROM to [facility] without pain provocation to allow for increased ease of ADLs.  Baseline: see objective Goal status: IN PROGRESS  5.  Patient will demonstrate improved functional UE strength as demonstrated by 4+/5 shoulder strength. Baseline: see objective Goal status: IN PROGRESS  6  Patient will report <50% impairment on QuickDash to demonstrate improved functional ability.  Baseline: 63.6% Goal status: IN PROGRESS  PLAN: PT FREQUENCY: 1-2x/week  PT DURATION: 10 weeks  PLANNED INTERVENTIONS: Therapeutic exercises, Therapeutic activity, Neuromuscular re-education, Balance training, Gait training, Patient/Family education, Self Care, Joint mobilization, Dry Needling, Spinal mobilization, Cryotherapy, Moist heat, scar mobilization, Vasopneumatic device, Ultrasound, Manual therapy, and Re-evaluation  PLAN FOR NEXT SESSION: Await MD recommendations,  continue per protocol, post op week 4 for on 05/05/22.  NWB LUE to 05/19/22.     Sherrel Shafer, PT 05/08/2022, 12:10 PM

## 2022-05-08 ENCOUNTER — Ambulatory Visit: Payer: Medicare HMO | Admitting: Physical Therapy

## 2022-05-08 ENCOUNTER — Encounter: Payer: Self-pay | Admitting: Physical Therapy

## 2022-05-08 DIAGNOSIS — M79622 Pain in left upper arm: Secondary | ICD-10-CM

## 2022-05-08 DIAGNOSIS — R600 Localized edema: Secondary | ICD-10-CM

## 2022-05-08 DIAGNOSIS — M25612 Stiffness of left shoulder, not elsewhere classified: Secondary | ICD-10-CM | POA: Diagnosis not present

## 2022-05-08 DIAGNOSIS — M6281 Muscle weakness (generalized): Secondary | ICD-10-CM

## 2022-05-08 DIAGNOSIS — M25622 Stiffness of left elbow, not elsewhere classified: Secondary | ICD-10-CM

## 2022-05-08 DIAGNOSIS — M25632 Stiffness of left wrist, not elsewhere classified: Secondary | ICD-10-CM

## 2022-05-22 ENCOUNTER — Encounter: Payer: Medicare HMO | Admitting: Physical Therapy

## 2022-05-29 ENCOUNTER — Encounter: Payer: Medicare HMO | Admitting: Physical Therapy

## 2022-06-05 ENCOUNTER — Encounter: Payer: Medicare HMO | Admitting: Physical Therapy

## 2022-06-12 ENCOUNTER — Encounter: Payer: Medicare HMO | Admitting: Physical Therapy
